# Patient Record
Sex: Male | Born: 1935 | Race: White | Hispanic: No | Marital: Married | Smoking: Former smoker
Health system: Southern US, Community
[De-identification: ages and names within clinical notes are randomized; demographics above are authoritative.]

## PROBLEM LIST (undated history)

## (undated) DIAGNOSIS — I252 Old myocardial infarction: Secondary | ICD-10-CM

## (undated) DIAGNOSIS — E785 Hyperlipidemia, unspecified: Secondary | ICD-10-CM

## (undated) HISTORY — PX: HERNIA REPAIR: SHX51

---

## 2006-11-05 ENCOUNTER — Ambulatory Visit: Payer: Self-pay | Admitting: Ophthalmology

## 2008-04-14 ENCOUNTER — Other Ambulatory Visit: Payer: Self-pay

## 2008-04-14 ENCOUNTER — Ambulatory Visit: Payer: Self-pay | Admitting: Urology

## 2008-04-27 ENCOUNTER — Ambulatory Visit: Payer: Self-pay | Admitting: Urology

## 2009-10-08 ENCOUNTER — Ambulatory Visit: Payer: Self-pay | Admitting: Gastroenterology

## 2011-02-05 ENCOUNTER — Encounter: Payer: Self-pay | Admitting: Family Medicine

## 2011-02-05 ENCOUNTER — Emergency Department: Payer: Self-pay | Admitting: Internal Medicine

## 2011-02-05 ENCOUNTER — Ambulatory Visit (INDEPENDENT_AMBULATORY_CARE_PROVIDER_SITE_OTHER): Payer: Medicare Other | Admitting: Family Medicine

## 2011-02-05 DIAGNOSIS — R5381 Other malaise: Secondary | ICD-10-CM

## 2011-02-05 DIAGNOSIS — R5383 Other fatigue: Secondary | ICD-10-CM

## 2011-02-05 DIAGNOSIS — Z9119 Patient's noncompliance with other medical treatment and regimen: Secondary | ICD-10-CM | POA: Insufficient documentation

## 2011-02-05 DIAGNOSIS — R9431 Abnormal electrocardiogram [ECG] [EKG]: Secondary | ICD-10-CM | POA: Insufficient documentation

## 2011-02-05 DIAGNOSIS — Z91199 Patient's noncompliance with other medical treatment and regimen due to unspecified reason: Secondary | ICD-10-CM | POA: Insufficient documentation

## 2011-02-05 DIAGNOSIS — F17200 Nicotine dependence, unspecified, uncomplicated: Secondary | ICD-10-CM

## 2011-02-05 DIAGNOSIS — R079 Chest pain, unspecified: Secondary | ICD-10-CM

## 2011-02-05 DIAGNOSIS — R0602 Shortness of breath: Secondary | ICD-10-CM

## 2011-02-16 NOTE — Assessment & Plan Note (Signed)
Summary: CHEST PAIN IN THE MIDDLE OF CHEST   Vital Signs:  Patient Profile:   77 Years Old Male CC:      chest pain sternum area per patient Height:     69 inches Weight:      194 pounds O2 Sat:      98 % O2 treatment:    Room Air Temp:     98.2 degrees F oral Pulse rate:   68 / minute Pulse rhythm:   regular Resp:     20 per minute BP sitting:   120 / 80  (left arm) Cuff size:   large  Pt. in pain?   yes    Location:   chest    Type:       heaviness  Vitals Entered By: Providence Crosby LPN (February 05, 2011 12:51 PM)                   Current Allergies (reviewed today): ! ASPIRIN (ASPIRIN)History of Present Illness History from: patient Reason for visit: see chief complaint Chief Complaint: chest pain sternum area per patient History of Present Illness: Chest pain/pressure started 2 days ago on 02/03/2011; Worse with exertion: Relieved by rest;  No known cardiac history;   Pt does not see doctors regularly.  He says he has no SOB except for these episodes.  He says that he walked about 300 - 400 ft 2 days ago and he first noticed a heaviness in the center of the chest that would go away if he would sit down;  Comes and gradually goes away. Pt says "Feels as if I ate something and it went to my chest" Pt has no knowlege of his cholesterol numbers and has not had any blood work done in a long time.  He is very hesitant to seek any medical attention or care but his wife was able to get him to finally come to this clinic today.     REVIEW OF SYSTEMS Constitutional Symptoms       Complains of fatigue.     Denies fever, chills, night sweats, weight loss, and weight gain.  Eyes       Complains of glasses.      Denies change in vision, eye pain, eye discharge, contact lenses, and eye surgery. Ear/Nose/Throat/Mouth       Complains of hearing loss/aids and frequent runny nose.      Denies change in hearing, ear pain, ear discharge, dizziness, frequent nose bleeds, sinus problems, sore  throat, hoarseness, and tooth pain or bleeding.  Respiratory       Complains of shortness of breath.      Denies dry cough, productive cough, wheezing, asthma, bronchitis, and emphysema/COPD.  Cardiovascular       Complains of chest pain and tires easily with exhertion.      Denies murmurs.    Gastrointestinal       Complains of indigestion.      Denies stomach pain, nausea/vomiting, diarrhea, constipation, and blood in bowel movements. Genitourniary       Denies painful urination, kidney stones, and loss of urinary control. Neurological       Denies paralysis, seizures, and fainting/blackouts. Musculoskeletal       Denies muscle pain, joint pain, joint stiffness, decreased range of motion, redness, swelling, muscle weakness, and gout.  Skin       Denies bruising, unusual mles/lumps or sores, and hair/skin or nail changes.  Psych       Denies  mood changes, temper/anger issues, anxiety/stress, speech problems, depression, and sleep problems.  Past History:  Past Medical History: Chronic Active Nicotine Dependence (Chews Tobacco)  Past Surgical History: Denies surgical history  Family History: His mother lived to be 49 years old  Social History: Pt does not believe in going to doctors.  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  He is married.  He chews tobacco.   Physical Exam General appearance: well developed, well nourished, no acute distress Head: normocephalic, atraumatic Eyes: conjunctivae and lids normal Pupils: equal, round, reactive to light Ears: normal, no lesions or deformities Nasal: mucosa pink, nonedematous, no septal deviation, turbinates normal Oral/Pharynx: tongue normal, posterior pharynx without erythema or exudate, poor dentition Neck: neck supple,  trachea midline, no masses Chest/Lungs: no chest wall tenderness; no rales, wheezes, or rhonchi bilateral, breath sounds equal without effort Heart: regular rate and  rhythm, no murmur Abdomen: soft,  non-tender without obvious organomegaly Extremities: normal extremities Neurological: grossly intact and non-focal Skin: no obvious rashes or lesions MSE: oriented to time, place, and person Assessment Problems:   New Problems: ABNORMAL ELECTROCARDIOGRAM (ICD-794.31) SHORTNESS OF BREATH (ICD-786.05) FATIGUE, ACUTE (ICD-780.79) CHEST PAIN, ACUTE (ICD-786.50) TOBACCO ABUSE (ICD-305.1) PERS HX NONCOMPLIANCE W/MED TX PRS HAZARDS HLTH (ICD-V15.81)   Patient Education: Patient and/or caregiver instructed in the following: rest, quit smoking. The risks, benefits and possible side effects were clearly explained and discussed with the patient.  The patient verbalized clear understanding.  The patient was given instructions to return if symptoms don't improve, worsen or new changes develop.  If it is not during clinic hours and the patient cannot get back to this clinic then the patient was told to seek medical care at an available urgent care or emergency department.  The patient verbalized understanding.   Demonstrates willingness to comply.  Plan Planning Comments:   I CALLED [facility] AND SPOKE WITH THEM AND SENT THE PATIENT DIRECTLY OVER TO THEM TO BE EVALUATED AND TREATED FOR CHEST PAIN.   THE PATIENT DECLINED EMS TRANSPORT AND WIFE TOOK HIM OVER BY PRIVATE VEHICLE.  THE PATIENT DID NOT WANT TO GO BUT I TOLD HIM THAT IT WAS ABSOLUTELY ESSENTIAL AND IMPORTANT FOR HIM TO GO ON OVER THERE NOW AND I INSISTED THAT HE GO OVER.  Follow Up: Follow up in 2-3 days if no improvement, Follow up on an as needed basis, Follow up with Primary Physician  The patient and/or caregiver has been counseled thoroughly with regard to medications prescribed including dosage, schedule, interactions, rationale for use, and possible side effects and they verbalize understanding.  Diagnoses and expected course of recovery discussed and will return if not improved as expected or if the condition worsens. Patient and/or  caregiver verbalized understanding.   Patient Instructions: 1)  GO DIRECTLY OVER TO [facility] EMERGENCY ROOM NOW.  2)  I HAVE ALREADY CALLED OVER AND NOTIFIED THEM TO EXPECT FOR YOUR ARRIVAL.  3)  I want you to establish care with a primary care physician and to please start going regularly.

## 2011-03-24 ENCOUNTER — Ambulatory Visit: Payer: Self-pay | Admitting: Cardiology

## 2013-07-17 ENCOUNTER — Inpatient Hospital Stay: Payer: Self-pay | Admitting: Internal Medicine

## 2013-07-17 LAB — COMPREHENSIVE METABOLIC PANEL
Bilirubin,Total: 0.6 mg/dL (ref 0.2–1.0)
Chloride: 107 mmol/L (ref 98–107)
EGFR (African American): >60
EGFR (Non-African Amer.): >60
Osmolality: 283 (ref 275–301)
SGOT(AST): 23 U/L (ref 15–37)

## 2013-07-17 LAB — URINALYSIS, COMPLETE
Bilirubin,UR: NEGATIVE
Glucose,UR: NEGATIVE mg/dL (ref 0–75)
Nitrite: NEGATIVE
RBC,UR: 2 /HPF (ref 0–5)
Specific Gravity: 1.014 (ref 1.003–1.030)

## 2013-07-17 LAB — CBC
HGB: 13.6 g/dL (ref 13.0–18.0)
MCV: 87 fL (ref 80–100)
Platelet: 143 10*3/uL — ABNORMAL LOW (ref 150–440)
RBC: 4.53 10*6/uL (ref 4.40–5.90)
RDW: 13.7 % (ref 11.5–14.5)

## 2013-07-17 LAB — CK TOTAL AND CKMB (NOT AT ARMC): CK, Total: 42 U/L (ref 35–232)

## 2013-07-18 LAB — BASIC METABOLIC PANEL
BUN: 19 mg/dL — ABNORMAL HIGH (ref 7–18)
Calcium, Total: 8.6 mg/dL (ref 8.5–10.1)
EGFR (African American): >60
EGFR (Non-African Amer.): >60
Glucose: 85 mg/dL (ref 65–99)
Osmolality: 281 (ref 275–301)

## 2013-07-18 LAB — CBC WITH DIFFERENTIAL/PLATELET
Basophil %: 0.7 %
Eosinophil #: 0.4 10*3/uL (ref 0.0–0.7)
HCT: 37.3 % — ABNORMAL LOW (ref 40.0–52.0)
Lymphocyte %: 29.3 %
MCH: 29.8 pg (ref 26.0–34.0)
MCHC: 34.2 g/dL (ref 32.0–36.0)
Monocyte %: 9.2 %
Neutrophil #: 3.5 10*3/uL (ref 1.4–6.5)

## 2013-07-18 LAB — LIPID PANEL: Cholesterol: 99 mg/dL (ref 0–200)

## 2015-03-12 NOTE — H&P (Signed)
PATIENT NAME:  SAVINO, WHISENANT MR#:  [id number] DATE OF BIRTH:  [birth year]  DATE OF ADMISSION:  07/17/2013  REFERRING PHYSICIAN:   Dr. Scotty Court PRIMARY CARE PHYSICIAN: Dr. Terance Hart  CHIEF COMPLAINT: Right upper and lower extremity weakness.   HISTORY OF PRESENT ILLNESS: The patient is a pleasant 81 year old male who states he does not have any active medical conditions. He is here with his wife and son. He was in his usual state of health this morning when he woke up. He walked to the mailbox which is about 45 yards away to get the mail and came back. During eating breakfast, as he tried to lift up the milk cup he could go only bring it up a few inches from his face and could not bring it to his lips secondary to weakness. His arm felt heavy, and he tried to get up and he felt like his leg was also not working. It was weak. He denied having any numbness. He has no history of strokes. The family did not notice any speech disturbances. He states he did not have any weakness on the left side of note. He was brought here where he underwent a CAT scan of the head which did not show any acute stroke, but the hospitalist service was contacted for further evaluation and management as he was noted to have decreased strength in the right upper and lower extremities. He feels he is stronger than prior, but he is not at his baseline.   PAST MEDICAL HISTORY: Denies.   ALLERGIES: Denies.   PAST SURGICAL HISTORY: Hernia repair.   SOCIAL HISTORY: No tobacco, alcohol or drug use. He states he is retired for 20 years.   FAMILY HISTORY: Mom with a stroke in her 51s but no other medical issues running in his family.   OUTPATIENT MEDICATIONS: He is on aspirin 81 mg daily, last took it  today, simvastatin 40 mg daily, Toprol-XL 25 mg daily, vitamin D3 1 tab once a day.   REVIEW OF SYSTEMS:   CONSTITUTIONAL: He denies having any fever, fatigue, generalized weakness or weight changes.  EYES: No blurry vision or  double vision.  ENT: No tinnitus or hearing loss. No epistaxis.  RESPIRATORY: No cough, wheezing or COPD.  He has some dyspnea on exertion chronically.  CARDIOVASCULAR: No chest pains, palpitations or swelling in the legs. Denies hypertension or any arrhythmias or butterfly sensations in his chest. No history of heart attack or CHF.  GASTROINTESTINAL: No nausea, vomiting, diarrhea, abdominal pain, hematemesis or melena.  GENITOURINARY: Denies dysuria or hematuria.  HEMATOLOGIC/LYMPHATIC: Denies anemia or easy bruising.  SKIN: No rashes.  MUSCULOSKELETAL: Denies arthritis or gout. NEUROLOGICAL: No history of stroke in the past, although he has had weak spells in the past.  No shakes or seizure activity. No dementia.  PSYCHIATRIC: Denies anxiety or insomnia.   PHYSICAL EXAMINATION: VITAL SIGNS: Temperature on arrival 97.7, pulse rate 71, respiratory rate 18, blood pressure 149/69, O2 sat 99% on room air, last blood pressure is 153/84.  GENERAL: The patient is a well-developed Caucasian male lying in bed, no obvious distress.  HEENT: Normocephalic, atraumatic. Pupils are equal and reactive. Anicteric sclerae. Extraocular muscles intact. Moist mucous membranes.  NECK: Supple. No thyroid tenderness. No cervical lymphadenopathy.  CARDIOVASCULAR: S1, S2, regular rate and rhythm. No significant murmurs, rubs or gallops.  LUNGS: Clear to auscultation without wheezing, rhonchi or rales.  ABDOMEN: Soft, nontender, nondistended. Positive bowel sounds in all quadrants.  EXTREMITIES: No significant lower extremity  edema.  SKIN: No obvious rashes.  NEUROLOGIC: Cranial nerves II through XII are grossly intact. Strength in the right upper extremity is 4+/5. In the right lower extremity it is 4 out of 5. In the left upper and lower extremity it is 5 out of 5. Sensation is intact to light touch on all fields. The patient does have positive Romberg's on the right, and he has rapid alternating movements in the  right upper extremity which is abnormal. He does have abnormal shin-to-heel test on the right lower extremity as well.  PSYCHIATRIC: Awake, alert, oriented x 3 but very nonchalant and states that there is nothing wrong with him, that he would rather he could be home and does not want a stay in the hospital for too long.  LABS AND IMAGING:  A CAT scan of the head shows chronic and involutional changes without evidence of acute abnormalities.  Glucose 111, BUN 21, creatinine 1.08, sodium 140, potassium 3.8. LFTs within normal limits. Troponin negative. CK total negative. WBC 6.9, hemoglobin 13.6, platelets are 143.  EKG: Normal sinus rhythm, low voltage QRS. There are Q waves in lead III. No acute ST elevations or depressions.   ASSESSMENT AND PLAN: We have an 81 year old male who denies having any medical issues, yet is on a beta blocker, aspirin and a statin, who comes in for acute onset right upper extremity as well as lower extremity weakness. He appears to have a right-sided hemiparesis with some coordination issues, and I suspect he is having acute stroke, possibly posterior versus a basal ganglia. At this point, we would admit him to the hospital, although he is reluctant to stay for too long, and he is adamant that he wants to be discharged tomorrow. He does not understand the severity of his illness. We would obtain an MRI of the brain as well as echocardiogram and ultrasound of the carotids for further work-up. We would admit him to telemetry with a remote monitor to also look for any significant arrhythmias. He denies having any history of strokes, CHF in the past. It is possible that this is embolic. His blood pressure is not significantly elevated. Would do frequent neuro checks. Would allow permissive hypertension by holding his  beta blocker and starting him on some IV fluids. Would obtain a PT as well as OT consult and start him on heparin for DVT prophylaxis. He does not appear to have any  speech issues at the moment. Would obtain a lipid profile. Although he is already on an aspirin, we would hold that and start him on clopidogrel at this point, resume the statin as well. Would check hemoglobin A1c as well.   CODE STATUS:  The patient is a FULL CODE.     TOTAL TIME SPENT:  50 minutes.  ____________________________ Krystal Eaton, MD sa:cb D: 07/17/2013 20:19:48 ET T: 07/17/2013 20:53:58 ET JOB#: [id number]  cc: Krystal Eaton, MD, <Dictator> Teena Irani. Terance Hart, MD Krystal Eaton MD ELECTRONICALLY SIGNED 08/12/2013 13:46

## 2015-03-12 NOTE — Discharge Summary (Signed)
PATIENT NAME:  George Hunt, George Hunt MR#:  [id number] DATE OF BIRTH:  [birth year]  DATE OF ADMISSION:  07/17/2013 DATE OF DISCHARGE:  07/18/2013  DISCHARGE DIAGNOSES: 1.  Acute nonhemorrhagic corona radiata infarction.  2.  Diastolic heart failure, chronic.  3.  Hypertension.  4.  Dyslipidemia.   CONDITION ON DISCHARGE:  Stable.   CODE STATUS:  FULL CODE.  MEDICATIONS ON DISCHARGE: 1.  Simvastatin 40 mg once a day.  2.  Aspirin 81 mg once a day.  3.  Vitamin D3 once a day.  4.  Clopidogrel 75 mg once a day.  5.  Lisinopril 20 mg oral tablet once a day.   HOME HEALTH ON DISCHARGE:  Yes.   DIET ON DISCHARGE:  Low sodium, low fat, low cholesterol.  Diet consistency regular.   TIME FRAME TO FOLLOW-UP:  Within 2 to 4 weeks.  Routine follow-up with PMD advised.   HISTORY OF PRESENT ILLNESS:  An 81 year old male was in his usual state of health, but while trying to have his breakfast he tried to life a cup of milk.  He could not lift it up and felt severely weak.  He did not have any history of stroke.  He tried to wait for a few hours at home for his weakness on the right side of the body which was not going away so finally his wife convinced him to come to the Emergency Room.  He remained without much problem.  His weakness improved significantly.    He was taking aspirin, so added Plavix.  PT suggested home health aide which was arranged for him.  Carotid Doppler was not showing any blockages and so he was discharged home for further management.   OTHER MEDICAL ISSUES: 1.  Hypertension.  Continued Toprol-XL 25 mg daily.  2.  Dyslipidemia.  He was taking statin which we will continue.  3.  Tobacco chewing.  Advised tobacco cessation.  He agreed for that.   IMPORTANT LABORATORY RESULTS IN THE HOSPITAL:  White cell count was 6.9, hemoglobin was 13.6 and platelet count was 143.  Creatinine was 1.08 on admission.  Ultrasound carotid Doppler study, no evidence of hemodynamically significant stenosis.   MRI result as mentioned above.  Echocardiogram, mild global left ventricular systolic function, impaired relaxation LV diastolic filling, ejection fraction 45% to 50%.   Total time spent in this discharge 45 minutes.    ____________________________ Hope Pigeon Elisabeth Pigeon, MD vgv:ea D: 07/22/2013 22:26:15 ET T: 07/23/2013 00:29:30 ET JOB#: [id number]  cc: Hope Pigeon. Elisabeth Pigeon, MD, <Dictator> Altamese Dilling MD ELECTRONICALLY SIGNED 07/25/2013 19:10

## 2015-08-07 ENCOUNTER — Emergency Department
Admission: EM | Admit: 2015-08-07 | Discharge: 2015-08-07 | Disposition: A | Discharge status: Home / Self Care | Payer: Medicare Other | Attending: Emergency Medicine | Admitting: Emergency Medicine

## 2015-08-07 ENCOUNTER — Encounter: Payer: Self-pay | Admitting: Emergency Medicine

## 2015-08-07 ENCOUNTER — Emergency Department: Payer: Medicare Other

## 2015-08-07 DIAGNOSIS — Z87891 Personal history of nicotine dependence: Secondary | ICD-10-CM | POA: Insufficient documentation

## 2015-08-07 DIAGNOSIS — R109 Unspecified abdominal pain: Secondary | ICD-10-CM

## 2015-08-07 DIAGNOSIS — K8020 Calculus of gallbladder without cholecystitis without obstruction: Secondary | ICD-10-CM | POA: Insufficient documentation

## 2015-08-07 DIAGNOSIS — Z79899 Other long term (current) drug therapy: Secondary | ICD-10-CM | POA: Diagnosis not present

## 2015-08-07 DIAGNOSIS — Z7902 Long term (current) use of antithrombotics/antiplatelets: Secondary | ICD-10-CM | POA: Insufficient documentation

## 2015-08-07 DIAGNOSIS — R1013 Epigastric pain: Secondary | ICD-10-CM | POA: Diagnosis present

## 2015-08-07 HISTORY — DX: Old myocardial infarction: I25.2

## 2015-08-07 LAB — BASIC METABOLIC PANEL
ANION GAP: 10 (ref 5–15)
BUN: 19 mg/dL (ref 6–20)
CALCIUM: 9.2 mg/dL (ref 8.9–10.3)
CO2: 24 mmol/L (ref 22–32)
Chloride: 104 mmol/L (ref 101–111)
Creatinine, Ser: 0.88 mg/dL (ref 0.61–1.24)
Glucose, Bld: 130 mg/dL — ABNORMAL HIGH (ref 65–99)
POTASSIUM: 4.2 mmol/L (ref 3.5–5.1)
Sodium: 138 mmol/L (ref 135–145)

## 2015-08-07 LAB — HEPATIC FUNCTION PANEL
ALK PHOS: 71 U/L (ref 38–126)
ALT: 10 U/L — AB (ref 17–63)
AST: 27 U/L (ref 15–41)
Albumin: 4 g/dL (ref 3.5–5.0)
Bilirubin, Direct: 0.2 mg/dL (ref 0.1–0.5)
Indirect Bilirubin: 1 mg/dL — ABNORMAL HIGH (ref 0.3–0.9)
TOTAL PROTEIN: 7 g/dL (ref 6.5–8.1)
Total Bilirubin: 1.2 mg/dL (ref 0.3–1.2)

## 2015-08-07 LAB — CBC
HEMATOCRIT: 44.2 % (ref 40.0–52.0)
HEMOGLOBIN: 14.6 g/dL (ref 13.0–18.0)
MCH: 28.6 pg (ref 26.0–34.0)
MCHC: 33 g/dL (ref 32.0–36.0)
MCV: 86.5 fL (ref 80.0–100.0)
Platelets: 144 10*3/uL — ABNORMAL LOW (ref 150–440)
RBC: 5.11 MIL/uL (ref 4.40–5.90)
RDW: 13.6 % (ref 11.5–14.5)
WBC: 11.8 10*3/uL — ABNORMAL HIGH (ref 3.8–10.6)

## 2015-08-07 LAB — TROPONIN I

## 2015-08-07 LAB — LIPASE, BLOOD: Lipase: 25 U/L (ref 22–51)

## 2015-08-07 MED ORDER — PANTOPRAZOLE SODIUM 40 MG IV SOLR
40.0000 mg | Freq: Once | INTRAVENOUS | Status: AC
  Administered 2015-08-07: 40 mg via INTRAVENOUS
  Filled 2015-08-07: qty 40

## 2015-08-07 MED ORDER — ONDANSETRON HCL 4 MG/2ML IJ SOLN
4.0000 mg | Freq: Once | INTRAMUSCULAR | Status: AC
  Administered 2015-08-07: 4 mg via INTRAVENOUS
  Filled 2015-08-07: qty 2

## 2015-08-07 MED ORDER — ASPIRIN 81 MG PO CHEW
324.0000 mg | CHEWABLE_TABLET | Freq: Once | ORAL | Status: AC
  Administered 2015-08-07: 324 mg via ORAL
  Filled 2015-08-07: qty 4

## 2015-08-07 MED ORDER — PANTOPRAZOLE SODIUM 20 MG PO TBEC: 20.0000 mg | DELAYED_RELEASE_TABLET | Freq: Every day | ORAL | Status: DC

## 2015-08-07 NOTE — Discharge Instructions (Signed)
While your initial complaint was chest pain, your discomfort was more notable in the epigastric area. An ultrasound was performed which showed gallstones, but without sign of inflammation, obstruction, or infection.  He felt better following your stay in the emergency department. We discussed the option of staying in the hospital, but agreed that discharge home with close follow-up is reasonable. Please call for an appointment to see Dr. Lady Gary on Monday. Call and make an appointment to see your regular doctor later this week. Return to the emergency department if you have more upper abdominal pain, if you do have chest pain, shortness of breath, nausea vomiting, or other urgent concerns.  Cholelithiasis Cholelithiasis (also called gallstones) is a form of gallbladder disease in which gallstones form in your gallbladder. The gallbladder is an organ that stores bile made in the liver, which helps digest fats. Gallstones begin as small crystals and slowly grow into stones. Gallstone pain occurs when the gallbladder spasms and a gallstone is blocking the duct. Pain can also occur when a stone passes out of the duct.  RISK FACTORS  Being male.   Having multiple pregnancies. Health care providers sometimes advise removing diseased gallbladders before future pregnancies.   Being obese.  Eating a diet heavy in fried foods and fat.   Being older than 60 years and increasing age.   Prolonged use of medicines containing male hormones.   Having diabetes mellitus.   Rapidly losing weight.   Having a family history of gallstones (heredity).  SYMPTOMS  Nausea.   Vomiting.  Abdominal pain.   Yellowing of the skin (jaundice).   Sudden pain. It may persist from several minutes to several hours.  Fever.   Tenderness to the touch. In some cases, when gallstones do not move into the bile duct, people have no pain or symptoms. These are called "silent" gallstones.  TREATMENT Silent  gallstones do not need treatment. In severe cases, emergency surgery may be required. Options for treatment include:  Surgery to remove the gallbladder. This is the most common treatment.  Medicines. These do not always work and may take 6-12 months or more to work.  Shock wave treatment (extracorporeal biliary lithotripsy). In this treatment an ultrasound machine sends shock waves to the gallbladder to break gallstones into smaller pieces that can pass into the intestines or be dissolved by medicine. HOME CARE INSTRUCTIONS   Only take over-the-counter or prescription medicines for pain, discomfort, or fever as directed by your health care provider.   Follow a low-fat diet until seen again by your health care provider. Fat causes the gallbladder to contract, which can result in pain.   Follow up with your health care provider as directed. Attacks are almost always recurrent and surgery is usually required for permanent treatment.  SEEK IMMEDIATE MEDICAL CARE IF:   Your pain increases and is not controlled by medicines.   You have a fever or persistent symptoms for more than 2-3 days.   You have a fever and your symptoms suddenly get worse.   You have persistent nausea and vomiting.  MAKE SURE YOU:   Understand these instructions.  Will watch your condition.  Will get help right away if you are not doing well or get worse. Document Released: 11/02/2005 Document Revised: 07/09/2013 Document Reviewed: 04/30/2013 [facility] Patient Information 2015 [place], [place]. This information is not intended to replace advice given to you by your health care provider. Make sure you discuss any questions you have with your health care provider.

## 2015-08-07 NOTE — ED Provider Notes (Signed)
[facility] Emergency Department Berlie Hatchel Note  ____________________________________________  Time seen: 1020  I have reviewed the triage vital signs and the nursing notes.   HISTORY  Chief Complaint Chest Pain     HPI George Hunt is a 81 y.o. male who reports that he began to have chest discomfort at 1:30 AM last night. He points to his epigastric area as the location for this discomfort. He has no radiation. He has no chest pain or pressure further up on the sternum. He does report some nausea, but no shortness of breath and no diaphoresis.  The patient reports she has not been able to sleep tonight. He is a little bit fatigued currently.  The patient has had a heart catheterization 2 years ago. The cardiac catheter showed some minor blockage that had some re-vascular growth around it. No known myocardial infarction prior or since.     Past Medical History  Diagnosis Date  . MI, old     Patient Active Problem List   Diagnosis Date Noted  . TOBACCO ABUSE 02/05/2011  . ABNORMAL ELECTROCARDIOGRAM 02/05/2011  . PERS HX NONCOMPLIANCE W/MED TX PRS HAZARDS HLTH 02/05/2011    Past Surgical History  Procedure Laterality Date  . Hernia repair      Current Outpatient Rx  Name  Route  Sig  Dispense  Refill  . clopidogrel (PLAVIX) 75 MG tablet   Oral   Take 75 mg by mouth daily.         . simvastatin (ZOCOR) 40 MG tablet   Oral   Take 40 mg by mouth daily.         . pantoprazole (PROTONIX) 20 MG tablet   Oral   Take 1 tablet (20 mg total) by mouth daily.   14 tablet   0     Allergies Review of patient's allergies indicates no known allergies.  No family history on file.  Social History Social History  Substance Use Topics  . Smoking status: Former Games developer  . Smokeless tobacco: None  . Alcohol Use: No    Review of Systems  Constitutional: Negative for fever. ENT: Negative for sore throat. Cardiovascular: Positive for chest  discomfort/epigastric discomfort. See history of present illness Respiratory: Negative for shortness of breath. Gastrointestinal: Negative for vomiting and diarrhea. Positive for epigastric discomfort. See history of present illness Genitourinary: Negative for dysuria. Musculoskeletal: No myalgias or injuries. Skin: Negative for rash. Neurological: Negative for headaches   10-point ROS otherwise negative.  ____________________________________________   PHYSICAL EXAM:  VITAL SIGNS: ED Triage Vitals  Enc Vitals Group     BP 08/07/15 0941 150/69 mmHg     Pulse Rate 08/07/15 0941 68     Resp 08/07/15 0941 18     Temp 08/07/15 0941 97.6 F (36.4 C)     Temp Source 08/07/15 0941 Oral     SpO2 08/07/15 0941 97 %     Weight 08/07/15 0936 185 lb (83.915 kg)     Height 08/07/15 0936 5\' 10"  (1.778 m)     Head Cir --      Peak Flow --      Pain Score 08/07/15 0936 5     Pain Loc --      Pain Edu? --      Excl. in GC? --     Constitutional:  Alert and oriented. Appears mildly fatigued but in no distress. ENT   Head: Normocephalic and atraumatic.   Nose: No congestion/rhinnorhea.   Mouth/Throat:  Mucous membranes are moist. Cardiovascular: Normal rate, regular rhythm, no murmur noted Respiratory:  Normal respiratory effort, no tachypnea.    Breath sounds are clear and equal bilaterally.  Gastrointestinal: Soft, mild tenderness in the epigastric area. No distention.  Back: No muscle spasm, no tenderness, no CVA tenderness. Musculoskeletal: No deformity noted. Nontender with normal range of motion in all extremities.  No noted edema. Neurologic:  Normal speech and language. No gross focal neurologic deficits are appreciated.  Skin:  Skin is warm, dry. No rash noted. Psychiatric: Mood and affect are normal. Speech and behavior are normal.  ____________________________________________    LABS (pertinent positives/negatives)  Labs Reviewed  BASIC METABOLIC PANEL -  Abnormal; Notable for the following:    Glucose, Bld 130 (*)    All other components within normal limits  CBC - Abnormal; Notable for the following:    WBC 11.8 (*)    Platelets 144 (*)    All other components within normal limits  HEPATIC FUNCTION PANEL - Abnormal; Notable for the following:    ALT 10 (*)    Indirect Bilirubin 1.0 (*)    All other components within normal limits  TROPONIN I  LIPASE, BLOOD     ____________________________________________   EKG  ED ECG REPORT I, KAMINSKI,DAVID W, the attending physician, personally viewed and interpreted this ECG.   Date: 08/07/2015  EKG Time: 939  Rate: 68  Rhythm: Normal sinus rhythm  Axis: Normal  Intervals: Normal  ST&T Change: None noted   ____________________________________________    RADIOLOGY  Chest x-ray: FINDINGS: The heart size and vascular pattern are normal. There is mild bibasilar atelectasis. Lungs otherwise clear.  IMPRESSION: No acute findings   Ultrasound:  IMPRESSION: Cholelithiasis without sonographic evidence of acute cholecystitis.  Right renal cysts.  Hyperechoic appearance of the liver parenchyma, usually associated with hepatic steatosis. ____________________________________________    INITIAL IMPRESSION / ASSESSMENT AND PLAN / ED COURSE  Pertinent labs & imaging results that were available during my care of the patient were reviewed by me and considered in my medical decision making (see chart for details).  Well-appearing 81 year old male in no acute distress. He reports chest pain, but the location is much more epigastric. Troponin is pending and cardiac conditions are still on the differential diagnosis, but I'm more concerned about gastrointestinal issues at this time. I have added on a lipase and hepatic function tests. Labs are pending. EKG shows no ischemic changes. We will treat him with Protonix +  aspirin.  ----------------------------------------- 1:30 PM on  08/07/2015 -----------------------------------------  Troponin is negative. Blood tests overall are reasonable. Chest x-ray is negative. Ultrasound of gallbladder and biliary tree is pending.  ----------------------------------------- 1:49 PM on 08/07/2015 -----------------------------------------  Ultrasound shows cholelithiasis without evidence of cholecystitis.  Reexamination of the patient finds him comfortable. He reports he feels better. No acute distress.  I have discussed with him and the family the option of staying in the hospital. I have a low suspicion for this being a cardiac source and uncomfortable with him going home. The patient prefers discharge home.  I've spoken with Dr. Juliann Pares, [facility] clinic cardiology. He agrees with this plan. He will pass word on to Dr. Lady Gary, who had performed the diagnostic catheterization 2 years ago, to help arrange for close follow-up.  ____________________________________________   FINAL CLINICAL IMPRESSION(S) / ED DIAGNOSES  Final diagnoses:  Abdominal pain  Calculus of gallbladder without cholecystitis without obstruction      Darien Ramus, MD 08/07/15 1359

## 2015-08-07 NOTE — ED Notes (Signed)
Pt has midsternal cp that began 0100 this am. Son states he has had an MI in the past.

## 2015-11-24 DIAGNOSIS — K219 Gastro-esophageal reflux disease without esophagitis: Secondary | ICD-10-CM | POA: Diagnosis not present

## 2015-11-24 DIAGNOSIS — R413 Other amnesia: Secondary | ICD-10-CM | POA: Diagnosis not present

## 2015-11-24 DIAGNOSIS — I10 Essential (primary) hypertension: Secondary | ICD-10-CM | POA: Diagnosis not present

## 2015-11-24 DIAGNOSIS — I679 Cerebrovascular disease, unspecified: Secondary | ICD-10-CM | POA: Diagnosis not present

## 2015-11-24 DIAGNOSIS — E782 Mixed hyperlipidemia: Secondary | ICD-10-CM | POA: Diagnosis not present

## 2015-11-24 DIAGNOSIS — G629 Polyneuropathy, unspecified: Secondary | ICD-10-CM | POA: Diagnosis not present

## 2016-05-24 DIAGNOSIS — I679 Cerebrovascular disease, unspecified: Secondary | ICD-10-CM | POA: Diagnosis not present

## 2016-05-24 DIAGNOSIS — E782 Mixed hyperlipidemia: Secondary | ICD-10-CM | POA: Diagnosis not present

## 2016-05-24 DIAGNOSIS — I10 Essential (primary) hypertension: Secondary | ICD-10-CM | POA: Diagnosis not present

## 2016-05-24 DIAGNOSIS — R413 Other amnesia: Secondary | ICD-10-CM | POA: Diagnosis not present

## 2016-05-24 DIAGNOSIS — K219 Gastro-esophageal reflux disease without esophagitis: Secondary | ICD-10-CM | POA: Diagnosis not present

## 2016-05-24 DIAGNOSIS — G629 Polyneuropathy, unspecified: Secondary | ICD-10-CM | POA: Diagnosis not present

## 2016-05-26 DIAGNOSIS — E782 Mixed hyperlipidemia: Secondary | ICD-10-CM | POA: Diagnosis not present

## 2016-11-24 DIAGNOSIS — K5909 Other constipation: Secondary | ICD-10-CM | POA: Diagnosis not present

## 2016-11-24 DIAGNOSIS — G629 Polyneuropathy, unspecified: Secondary | ICD-10-CM | POA: Diagnosis not present

## 2016-11-24 DIAGNOSIS — K219 Gastro-esophageal reflux disease without esophagitis: Secondary | ICD-10-CM | POA: Diagnosis not present

## 2016-11-24 DIAGNOSIS — E782 Mixed hyperlipidemia: Secondary | ICD-10-CM | POA: Diagnosis not present

## 2016-11-24 DIAGNOSIS — I679 Cerebrovascular disease, unspecified: Secondary | ICD-10-CM | POA: Diagnosis not present

## 2016-11-24 DIAGNOSIS — R413 Other amnesia: Secondary | ICD-10-CM | POA: Diagnosis not present

## 2016-11-24 DIAGNOSIS — I10 Essential (primary) hypertension: Secondary | ICD-10-CM | POA: Diagnosis not present

## 2017-04-27 ENCOUNTER — Inpatient Hospital Stay
Admission: EM | Admit: 2017-04-27 | Discharge: 2017-04-29 | DRG: 871 | Disposition: A | Discharge status: Home / Self Care | Payer: PPO | Attending: Internal Medicine | Admitting: Internal Medicine

## 2017-04-27 ENCOUNTER — Encounter: Payer: Self-pay | Admitting: Emergency Medicine

## 2017-04-27 ENCOUNTER — Emergency Department: Payer: PPO

## 2017-04-27 DIAGNOSIS — B9620 Unspecified Escherichia coli [E. coli] as the cause of diseases classified elsewhere: Secondary | ICD-10-CM | POA: Diagnosis not present

## 2017-04-27 DIAGNOSIS — Z87891 Personal history of nicotine dependence: Secondary | ICD-10-CM

## 2017-04-27 DIAGNOSIS — N3001 Acute cystitis with hematuria: Secondary | ICD-10-CM | POA: Diagnosis not present

## 2017-04-27 DIAGNOSIS — R4182 Altered mental status, unspecified: Secondary | ICD-10-CM

## 2017-04-27 DIAGNOSIS — I2510 Atherosclerotic heart disease of native coronary artery without angina pectoris: Secondary | ICD-10-CM | POA: Diagnosis not present

## 2017-04-27 DIAGNOSIS — Z7982 Long term (current) use of aspirin: Secondary | ICD-10-CM

## 2017-04-27 DIAGNOSIS — I252 Old myocardial infarction: Secondary | ICD-10-CM

## 2017-04-27 DIAGNOSIS — R6520 Severe sepsis without septic shock: Secondary | ICD-10-CM | POA: Diagnosis present

## 2017-04-27 DIAGNOSIS — A419 Sepsis, unspecified organism: Principal | ICD-10-CM | POA: Diagnosis present

## 2017-04-27 DIAGNOSIS — N400 Enlarged prostate without lower urinary tract symptoms: Secondary | ICD-10-CM | POA: Diagnosis present

## 2017-04-27 DIAGNOSIS — E785 Hyperlipidemia, unspecified: Secondary | ICD-10-CM | POA: Diagnosis present

## 2017-04-27 DIAGNOSIS — N390 Urinary tract infection, site not specified: Secondary | ICD-10-CM

## 2017-04-27 DIAGNOSIS — G9341 Metabolic encephalopathy: Secondary | ICD-10-CM | POA: Diagnosis not present

## 2017-04-27 DIAGNOSIS — Z7902 Long term (current) use of antithrombotics/antiplatelets: Secondary | ICD-10-CM | POA: Diagnosis not present

## 2017-04-27 DIAGNOSIS — Z79899 Other long term (current) drug therapy: Secondary | ICD-10-CM

## 2017-04-27 DIAGNOSIS — J9811 Atelectasis: Secondary | ICD-10-CM | POA: Diagnosis not present

## 2017-04-27 DIAGNOSIS — R509 Fever, unspecified: Secondary | ICD-10-CM | POA: Diagnosis not present

## 2017-04-27 LAB — COMPREHENSIVE METABOLIC PANEL
ALT: 11 U/L — AB (ref 17–63)
AST: 20 U/L (ref 15–41)
Albumin: 3.5 g/dL (ref 3.5–5.0)
Alkaline Phosphatase: 83 U/L (ref 38–126)
Anion gap: 10 (ref 5–15)
BILIRUBIN TOTAL: 2.4 mg/dL — AB (ref 0.3–1.2)
BUN: 22 mg/dL — AB (ref 6–20)
CO2: 23 mmol/L (ref 22–32)
CREATININE: 1.15 mg/dL (ref 0.61–1.24)
Calcium: 8.8 mg/dL — ABNORMAL LOW (ref 8.9–10.3)
Chloride: 103 mmol/L (ref 101–111)
GFR calc Af Amer: >60 mL/min (ref >60)
GFR, EST NON AFRICAN AMERICAN: 56 mL/min — AB (ref >60)
Glucose, Bld: 150 mg/dL — ABNORMAL HIGH (ref 65–99)
Potassium: 3.8 mmol/L (ref 3.5–5.1)
Sodium: 136 mmol/L (ref 135–145)
TOTAL PROTEIN: 6.9 g/dL (ref 6.5–8.1)

## 2017-04-27 LAB — CBC WITH DIFFERENTIAL/PLATELET
BASOS ABS: 0.1 10*3/uL (ref 0–0.1)
BASOS PCT: 1 %
EOS ABS: 0 10*3/uL (ref 0–0.7)
Eosinophils Relative: 0 %
HEMATOCRIT: 40 % (ref 40.0–52.0)
HEMOGLOBIN: 13.5 g/dL (ref 13.0–18.0)
Lymphocytes Relative: 3 %
Lymphs Abs: 0.5 10*3/uL — ABNORMAL LOW (ref 1.0–3.6)
MCH: 29.1 pg (ref 26.0–34.0)
MCHC: 33.7 g/dL (ref 32.0–36.0)
MCV: 86.6 fL (ref 80.0–100.0)
Monocytes Absolute: 1.2 10*3/uL — ABNORMAL HIGH (ref 0.2–1.0)
Monocytes Relative: 8 %
NEUTROS ABS: 14.6 10*3/uL — AB (ref 1.4–6.5)
NEUTROS PCT: 88 %
Platelets: 132 10*3/uL — ABNORMAL LOW (ref 150–440)
RBC: 4.62 MIL/uL (ref 4.40–5.90)
RDW: 13.4 % (ref 11.5–14.5)
WBC: 16.4 10*3/uL — AB (ref 3.8–10.6)

## 2017-04-27 LAB — PROTIME-INR
INR: 1.27
PROTHROMBIN TIME: 16 s — AB (ref 11.4–15.2)

## 2017-04-27 LAB — URINALYSIS, COMPLETE (UACMP) WITH MICROSCOPIC
Bilirubin Urine: NEGATIVE
GLUCOSE, UA: NEGATIVE mg/dL
Ketones, ur: 20 mg/dL — AB
Nitrite: NEGATIVE
PH: 5 (ref 5.0–8.0)
PROTEIN: 100 mg/dL — AB
Specific Gravity, Urine: 1.024 (ref 1.005–1.030)

## 2017-04-27 LAB — TSH: TSH: 0.931 u[IU]/mL (ref 0.350–4.500)

## 2017-04-27 LAB — LACTIC ACID, PLASMA: Lactic Acid, Venous: 1.3 mmol/L (ref 0.5–1.9)

## 2017-04-27 LAB — LIPASE, BLOOD: LIPASE: 22 U/L (ref 11–51)

## 2017-04-27 LAB — TROPONIN I

## 2017-04-27 LAB — PROCALCITONIN: Procalcitonin: 0.19 ng/mL

## 2017-04-27 LAB — APTT: APTT: 34 s (ref 24–36)

## 2017-04-27 MED ORDER — ACETAMINOPHEN 650 MG RE SUPP: 650.0000 mg | Freq: Four times a day (QID) | RECTAL | Status: DC | PRN

## 2017-04-27 MED ORDER — ACETAMINOPHEN 325 MG PO TABS: 650.0000 mg | ORAL_TABLET | Freq: Four times a day (QID) | ORAL | Status: DC | PRN

## 2017-04-27 MED ORDER — ENOXAPARIN SODIUM 40 MG/0.4ML ~~LOC~~ SOLN
40.0000 mg | SUBCUTANEOUS | Status: DC
  Administered 2017-04-27: 21:00:00 40 mg via SUBCUTANEOUS
  Filled 2017-04-27 (×2): qty 0.4

## 2017-04-27 MED ORDER — VANCOMYCIN HCL IN DEXTROSE 1-5 GM/200ML-% IV SOLN
1000.0000 mg | INTRAVENOUS | Status: DC
  Administered 2017-04-27: 21:00:00 1000 mg via INTRAVENOUS
  Filled 2017-04-27 (×2): qty 200

## 2017-04-27 MED ORDER — SIMVASTATIN 20 MG PO TABS
40.0000 mg | ORAL_TABLET | Freq: Every day | ORAL | Status: DC
  Administered 2017-04-27 – 2017-04-29 (×3): 40 mg via ORAL
  Filled 2017-04-27 (×3): qty 2

## 2017-04-27 MED ORDER — PIPERACILLIN-TAZOBACTAM 3.375 G IVPB 30 MIN
3.3750 g | Freq: Once | INTRAVENOUS | Status: AC
  Administered 2017-04-27: 3.375 g via INTRAVENOUS
  Filled 2017-04-27: qty 50

## 2017-04-27 MED ORDER — VANCOMYCIN HCL IN DEXTROSE 1-5 GM/200ML-% IV SOLN
1000.0000 mg | Freq: Once | INTRAVENOUS | Status: AC
  Administered 2017-04-27: 1000 mg via INTRAVENOUS
  Filled 2017-04-27: qty 200

## 2017-04-27 MED ORDER — SODIUM CHLORIDE 0.9 % IV BOLUS (SEPSIS)
500.0000 mL | Freq: Once | INTRAVENOUS | Status: AC
  Administered 2017-04-27: 500 mL via INTRAVENOUS

## 2017-04-27 MED ORDER — PIPERACILLIN-TAZOBACTAM 3.375 G IVPB
3.3750 g | Freq: Three times a day (TID) | INTRAVENOUS | Status: DC
  Administered 2017-04-27 – 2017-04-28 (×2): 3.375 g via INTRAVENOUS
  Filled 2017-04-27 (×4): qty 50

## 2017-04-27 MED ORDER — ASPIRIN EC 81 MG PO TBEC
81.0000 mg | DELAYED_RELEASE_TABLET | Freq: Every day | ORAL | Status: DC
  Administered 2017-04-27 – 2017-04-29 (×3): 81 mg via ORAL
  Filled 2017-04-27 (×3): qty 1

## 2017-04-27 MED ORDER — SODIUM CHLORIDE 0.9 % IV SOLN
INTRAVENOUS | Status: DC
  Administered 2017-04-27 – 2017-04-28 (×2): via INTRAVENOUS

## 2017-04-27 MED ORDER — SODIUM CHLORIDE 0.9 % IV BOLUS (SEPSIS)
1000.0000 mL | Freq: Once | INTRAVENOUS | Status: AC
  Administered 2017-04-27: 1000 mL via INTRAVENOUS

## 2017-04-27 MED ORDER — ONDANSETRON HCL 4 MG PO TABS: 4.0000 mg | ORAL_TABLET | Freq: Four times a day (QID) | ORAL | Status: DC | PRN

## 2017-04-27 MED ORDER — ONDANSETRON HCL 4 MG/2ML IJ SOLN: 4.0000 mg | Freq: Four times a day (QID) | INTRAMUSCULAR | Status: DC | PRN

## 2017-04-27 MED ORDER — DOCUSATE SODIUM 100 MG PO CAPS
100.0000 mg | ORAL_CAPSULE | Freq: Two times a day (BID) | ORAL | Status: DC
  Administered 2017-04-27 – 2017-04-29 (×3): 100 mg via ORAL
  Filled 2017-04-27 (×4): qty 1

## 2017-04-27 MED ORDER — CLOPIDOGREL BISULFATE 75 MG PO TABS
75.0000 mg | ORAL_TABLET | Freq: Every day | ORAL | Status: DC
  Administered 2017-04-27 – 2017-04-29 (×3): 75 mg via ORAL
  Filled 2017-04-27 (×3): qty 1

## 2017-04-27 NOTE — ED Provider Notes (Signed)
[facility] Emergency Department Provider Note  ____________________________________________  Time seen: Approximately 4:38 PM  I have reviewed the triage vital signs and the nursing notes.   HISTORY  Chief Complaint Altered Mental Status  Level 5 caveat:  Portions of the history and physical were unable to be obtained due to the patient's acute illness and altered mental status   HPI George Hunt is a 83 y.o. male brought to the ED via EMS due to fever and altered mental status since yesterday. Patient does not know why he is here. He reports that he feels fine and does not note any acute complaints. EMS report that patient has had urinary incontinence just for the last day or 2.     Past Medical History:  Diagnosis Date  . MI, old      Patient Active Problem List   Diagnosis Date Noted  . TOBACCO ABUSE 02/05/2011  . ABNORMAL ELECTROCARDIOGRAM 02/05/2011  . PERS HX NONCOMPLIANCE W/MED TX PRS HAZARDS HLTH 02/05/2011     Past Surgical History:  Procedure Laterality Date  . HERNIA REPAIR       Prior to Admission medications   Medication Sig Start Date End Date Taking? Authorizing Provider  aspirin EC 81 MG tablet Take 81 mg by mouth daily.   Yes [provider]  clopidogrel (PLAVIX) 75 MG tablet Take 75 mg by mouth daily.   Yes [provider]  simvastatin (ZOCOR) 40 MG tablet Take 40 mg by mouth daily.   Yes [provider]  pantoprazole (PROTONIX) 20 MG tablet Take 1 tablet (20 mg total) by mouth daily. 08/07/15   Darien Ramus, MD     Allergies Patient has no known allergies.   History reviewed. No pertinent family history.  Social History Social History  Substance Use Topics  . Smoking status: Former Games developer  . Smokeless tobacco: Never Used  . Alcohol use No    Review of Systems Unable to reliably obtained due to altered mental  status.  ____________________________________________   PHYSICAL EXAM:  VITAL SIGNS: ED Triage Vitals  Enc Vitals Group     BP 04/27/17 1613 (!) 142/65     Pulse Rate 04/27/17 1613 88     Resp 04/27/17 1613 20     Temp 04/27/17 1613 (!) 102.1 F (38.9 C)     Temp Source 04/27/17 1613 Rectal     SpO2 04/27/17 1613 94 %     Weight 04/27/17 1612 180 lb (81.6 kg)     Height 04/27/17 1612 5\' 9"  (1.753 m)     Head Circumference --      Peak Flow --      Pain Score --      Pain Loc --      Pain Edu? --      Excl. in GC? --     Vital signs reviewed, nursing assessments reviewed.   Constitutional:   Alert and orientedTo self. Not in distress. Eyes:   No scleral icterus.  EOMI. No nystagmus. No conjunctival pallor. PERRL. ENT   Head:   Normocephalic and atraumatic.   Nose:   No congestion/rhinnorhea.    Mouth/Throat:   Dry mucous membranes, no pharyngeal erythema. No peritonsillar mass.    Neck:   No meningismus. Full ROM Hematological/Lymphatic/Immunilogical:   No cervical lymphadenopathy. Cardiovascular:   RRR. Symmetric bilateral radial and DP pulses.  No murmurs.  Respiratory:   Normal respiratory effort without tachypnea/retractions. Breath sounds are clear and equal bilaterally.  No wheezes/rales/rhonchi. Gastrointestinal:   Soft and nontender. Non distended. There is no CVA tenderness.  No rebound, rigidity, or guarding. Genitourinary:   deferred Musculoskeletal:   Normal range of motion in all extremities. No joint effusions.  No lower extremity tenderness.  No edema. Neurologic:   Normal speech and language.  Motor grossly intact. No gross focal neurologic deficits are appreciated.  Skin:    Skin is warm, dry and intact. No rash noted.  No petechiae, purpura, or bullae.  ____________________________________________    LABS (pertinent positives/negatives) (all labs ordered are listed, but only abnormal results are displayed) Labs Reviewed  COMPREHENSIVE  METABOLIC PANEL - Abnormal; Notable for the following:       Result Value   Glucose, Bld 150 (*)    BUN 22 (*)    Calcium 8.8 (*)    ALT 11 (*)    Total Bilirubin 2.4 (*)    GFR calc non Af Amer 56 (*)    All other components within normal limits  URINALYSIS, COMPLETE (UACMP) WITH MICROSCOPIC - Abnormal; Notable for the following:    Color, Urine AMBER (*)    APPearance CLOUDY (*)    Hgb urine dipstick MODERATE (*)    Ketones, ur 20 (*)    Protein, ur 100 (*)    Leukocytes, UA MODERATE (*)    Bacteria, UA MANY (*)    Squamous Epithelial / LPF 0-5 (*)    All other components within normal limits  CBC WITH DIFFERENTIAL/PLATELET - Abnormal; Notable for the following:    WBC 16.4 (*)    Platelets 132 (*)    Neutro Abs 14.6 (*)    Lymphs Abs 0.5 (*)    Monocytes Absolute 1.2 (*)    All other components within normal limits  PROTIME-INR - Abnormal; Notable for the following:    Prothrombin Time 16.0 (*)    All other components within normal limits  CULTURE, BLOOD (ROUTINE X 2)  CULTURE, BLOOD (ROUTINE X 2)  URINE CULTURE  LACTIC ACID, PLASMA  APTT  LACTIC ACID, PLASMA  LIPASE, BLOOD  TROPONIN I  PROCALCITONIN   ____________________________________________   EKG  Interpreted by me Normal sinus rhythm rate of 88, normal axis and intervals. Normal QRS ST segments and T waves. One PVC on the strip.  ____________________________________________    RADIOLOGY  Dg Chest Port 1 View  Result Date: 04/27/2017 CLINICAL DATA:  Altered mental status. EXAM: PORTABLE CHEST 1 VIEW COMPARISON:  Chest radiograph 08/07/2015. FINDINGS: Monitoring leads overlie the patient. Enlarged cardiac and mediastinal contours. Low lung volumes. Basilar heterogeneous pulmonary opacities. No pleural effusion or pneumothorax. IMPRESSION: Low lung volumes with basilar atelectasis. Cardiac contours upper limits of normal. Electronically Signed   By: Annia Belt M.D.   On: 04/27/2017 16:37     ____________________________________________   PROCEDURES Procedures CRITICAL CARE Performed by: Scotty Court, [place] Gamero   Total critical care time: 35 minutes  Critical care time was exclusive of separately billable procedures and treating other patients.  Critical care was necessary to treat or prevent imminent or life-threatening deterioration.  Critical care was time spent personally by me on the following activities: development of treatment plan with patient and/or surrogate as well as nursing, discussions with consultants, evaluation of patient's response to treatment, examination of patient, obtaining history from patient or surrogate, ordering and performing treatments and interventions, ordering and review of laboratory studies, ordering and review of radiographic studies, pulse oximetry and re-evaluation of patient's condition.  ____________________________________________   INITIAL IMPRESSION /  ASSESSMENT AND PLAN / ED COURSE  Pertinent labs & imaging results that were available during my care of the patient were reviewed by me and considered in my medical decision making (see chart for details).    Clinical Course as of Apr 27 1726  Fri Apr 27, 2017  1622 P/w fever, AMS. Code sepsis. Ivf bolus, abx, sepsis workup.   [PS]    Clinical Course User Index [PS] Sharman Cheek, MD    ----------------------------------------- 5:27 PM on 04/27/2017 -----------------------------------------  Remains hemodynamically stable. Fever improving but still present. Workup reveals markedly leukocytosis, obvious urinary tract infection. No other infectious source identified. Low suspicion for meningitis or encephalitis. I will with hospitalist for admission.   ____________________________________________   FINAL CLINICAL IMPRESSION(S) / ED DIAGNOSES  Final diagnoses:  Altered mental status, unspecified altered mental status type  Sepsis, due to unspecified organism [facility])   Urinary tract infection without hematuria, site unspecified      New Prescriptions   No medications on file     Portions of this note were generated with dragon dictation software. Dictation errors may occur despite best attempts at proofreading.    Sharman Cheek, MD 04/27/17 1728

## 2017-04-27 NOTE — Progress Notes (Signed)
Pharmacy Antibiotic Note  George Hunt is a 83 y.o. male admitted on 04/27/2017 with sepsis.  Pharmacy has been consulted for vancomycin/piperacillin-tazobactam dosing.  Plan: Vancomycin 1000 mg IV x1 and piperacillin-tazobactam 3.375 g IV x1 given in the ED. Vancomycin 1000 mg IV every 24 hours.  Goal trough 15-20 mcg/mL. Zosyn 3.375g IV q8h (4 hour infusion).  Stacked dose= 6 hr Ke=0.043 T1/2=16.12 VD=49.49  Height: 5\' 9"  (175.3 cm) Weight: 180 lb (81.6 kg) IBW/kg (Calculated) : 70.7  Temp (24hrs), Avg:101.4 F (38.6 C), Min:100.4 F (38 C), Max:102.2 F (39 C)   Recent Labs Lab 04/27/17 1614 04/27/17 1615  WBC 16.4*  --   CREATININE 1.15  --   LATICACIDVEN  --  1.3    Estimated Creatinine Clearance: 47 mL/min (by C-G formula based on SCr of 1.15 mg/dL).    No Known Allergies  Antimicrobials this admission: Vancomycin 6/8 >> Zosyn 6/8 >>   Microbiology results: 6/8 BCx: sent 6/8 UCx: sent  Thank you for allowing pharmacy to be a part of this patient's care.  8/8, PharmD 04/27/2017 7:03 PM

## 2017-04-27 NOTE — H&P (Signed)
George Hunt is an 83 y.o. male.   Chief Complaint: Altered mental status HPI: the patient with past medical history of coronary artery disease status post MI presents emergency department with decreased alertness. The patient has had a decreased appetite for 2 days and has been less interactive today. When he seemed to be somewhat difficult to arouse his family called EMS for transportation to the hospital for evaluation. He was found to have urinary tract infectionand given IV antibiotics in the emergency department before the hospitalist service was called for admission.  Past Medical History:  Diagnosis Date  . MI, old     Past Surgical History:  Procedure Laterality Date  . HERNIA REPAIR      Family History  Problem Relation Age of Onset  . Hypertension Other    Social History:  reports that he has quit smoking. He has never used smokeless tobacco. He reports that he does not drink alcohol. His drug history is not on file.  Allergies: No Known Allergies  Medications Prior to Admission  Medication Sig Dispense Refill  . aspirin EC 81 MG tablet Take 81 mg by mouth daily.    . clopidogrel (PLAVIX) 75 MG tablet Take 75 mg by mouth daily.    . simvastatin (ZOCOR) 40 MG tablet Take 40 mg by mouth daily.    . pantoprazole (PROTONIX) 20 MG tablet Take 1 tablet (20 mg total) by mouth daily. 14 tablet 0    Results for orders placed or performed during the hospital encounter of 04/27/17 (from the past 48 hour(s))  Comprehensive metabolic panel     Status: Abnormal   Collection Time: 04/27/17  4:14 PM  Result Value Ref Range   Sodium 136 135 - 145 mmol/L   Potassium 3.8 3.5 - 5.1 mmol/L   Chloride 103 101 - 111 mmol/L   CO2 23 22 - 32 mmol/L   Glucose, Bld 150 (H) 65 - 99 mg/dL   BUN 22 (H) 6 - 20 mg/dL   Creatinine, Ser 1.15 0.61 - 1.24 mg/dL   Calcium 8.8 (L) 8.9 - 10.3 mg/dL   Total Protein 6.9 6.5 - 8.1 g/dL   Albumin 3.5 3.5 - 5.0 g/dL   AST 20 15 - 41 U/L   ALT 11 (L) 17  - 63 U/L   Alkaline Phosphatase 83 38 - 126 U/L   Total Bilirubin 2.4 (H) 0.3 - 1.2 mg/dL   GFR calc non Af Amer 56 (L) >60 mL/min   GFR calc Af Amer >60 >60 mL/min    Comment: (NOTE) The eGFR has been calculated using the CKD EPI equation. This calculation has not been validated in all clinical situations. eGFR's persistently <60 mL/min signify possible Chronic Kidney Disease.    Anion gap 10 5 - 15  Lipase, blood     Status: None   Collection Time: 04/27/17  4:14 PM  Result Value Ref Range   Lipase 22 11 - 51 U/L  Troponin I     Status: None   Collection Time: 04/27/17  4:14 PM  Result Value Ref Range   Troponin I <0.03 <0.03 ng/mL  CBC WITH DIFFERENTIAL     Status: Abnormal   Collection Time: 04/27/17  4:14 PM  Result Value Ref Range   WBC 16.4 (H) 3.8 - 10.6 K/uL   RBC 4.62 4.40 - 5.90 MIL/uL   Hemoglobin 13.5 13.0 - 18.0 g/dL   HCT 40.0 40.0 - 52.0 %   MCV 86.6 80.0 - 100.0  fL   MCH 29.1 26.0 - 34.0 pg   MCHC 33.7 32.0 - 36.0 g/dL   RDW 13.4 11.5 - 14.5 %   Platelets 132 (L) 150 - 440 K/uL   Neutrophils Relative % 88 %   Neutro Abs 14.6 (H) 1.4 - 6.5 K/uL   Lymphocytes Relative 3 %   Lymphs Abs 0.5 (L) 1.0 - 3.6 K/uL   Monocytes Relative 8 %   Monocytes Absolute 1.2 (H) 0.2 - 1.0 K/uL   Eosinophils Relative 0 %   Eosinophils Absolute 0.0 0 - 0.7 K/uL   Basophils Relative 1 %   Basophils Absolute 0.1 0 - 0.1 K/uL  APTT     Status: None   Collection Time: 04/27/17  4:14 PM  Result Value Ref Range   aPTT 34 24 - 36 seconds  Protime-INR     Status: Abnormal   Collection Time: 04/27/17  4:14 PM  Result Value Ref Range   Prothrombin Time 16.0 (H) 11.4 - 15.2 seconds   INR 1.27   Procalcitonin     Status: None   Collection Time: 04/27/17  4:14 PM  Result Value Ref Range   Procalcitonin 0.19 ng/mL    Comment:        Interpretation: PCT (Procalcitonin) <= 0.5 ng/mL: Systemic infection (sepsis) is not likely. Local bacterial infection is possible. (NOTE)          ICU PCT Algorithm               Non ICU PCT Algorithm    ----------------------------     ------------------------------         PCT < 0.25 ng/mL                 PCT < 0.1 ng/mL     Stopping of antibiotics            Stopping of antibiotics       strongly encouraged.               strongly encouraged.    ----------------------------     ------------------------------       PCT level decrease by               PCT < 0.25 ng/mL       >= 80% from peak PCT       OR PCT 0.25 - 0.5 ng/mL          Stopping of antibiotics                                             encouraged.     Stopping of antibiotics           encouraged.    ----------------------------     ------------------------------       PCT level decrease by              PCT >= 0.25 ng/mL       < 80% from peak PCT        AND PCT >= 0.5 ng/mL            Continuin g antibiotics  encouraged.       Continuing antibiotics            encouraged.    ----------------------------     ------------------------------     PCT level increase compared          PCT > 0.5 ng/mL         with peak PCT AND          PCT >= 0.5 ng/mL             Escalation of antibiotics                                          strongly encouraged.      Escalation of antibiotics        strongly encouraged.   TSH     Status: None   Collection Time: 04/27/17  4:14 PM  Result Value Ref Range   TSH 0.931 0.350 - 4.500 uIU/mL    Comment: Performed by a 3rd Generation assay with a functional sensitivity of <=0.01 uIU/mL.  Urinalysis, Complete w Microscopic     Status: Abnormal   Collection Time: 04/27/17  4:15 PM  Result Value Ref Range   Color, Urine AMBER (A) YELLOW    Comment: BIOCHEMICALS MAY BE AFFECTED BY COLOR   APPearance CLOUDY (A) CLEAR   Specific Gravity, Urine 1.024 1.005 - 1.030   pH 5.0 5.0 - 8.0   Glucose, UA NEGATIVE NEGATIVE mg/dL   Hgb urine dipstick MODERATE (A) NEGATIVE   Bilirubin Urine NEGATIVE  NEGATIVE   Ketones, ur 20 (A) NEGATIVE mg/dL   Protein, ur 100 (A) NEGATIVE mg/dL   Nitrite NEGATIVE NEGATIVE   Leukocytes, UA MODERATE (A) NEGATIVE   RBC / HPF 6-30 0 - 5 RBC/hpf   WBC, UA TOO NUMEROUS TO COUNT 0 - 5 WBC/hpf   Bacteria, UA MANY (A) NONE SEEN   Squamous Epithelial / LPF 0-5 (A) NONE SEEN   Mucous PRESENT   Lactic acid, plasma     Status: None   Collection Time: 04/27/17  4:15 PM  Result Value Ref Range   Lactic Acid, Venous 1.3 0.5 - 1.9 mmol/L   Dg Chest Port 1 View  Result Date: 04/27/2017 CLINICAL DATA:  Altered mental status. EXAM: PORTABLE CHEST 1 VIEW COMPARISON:  Chest radiograph 08/07/2015. FINDINGS: Monitoring leads overlie the patient. Enlarged cardiac and mediastinal contours. Low lung volumes. Basilar heterogeneous pulmonary opacities. No pleural effusion or pneumothorax. IMPRESSION: Low lung volumes with basilar atelectasis. Cardiac contours upper limits of normal. Electronically Signed   By: Lovey Newcomer M.D.   On: 04/27/2017 16:37    Review of Systems  Constitutional: Negative for chills and fever.  HENT: Negative for sore throat and tinnitus.   Eyes: Negative for blurred vision and redness.  Respiratory: Negative for cough and shortness of breath.   Cardiovascular: Negative for chest pain, palpitations, orthopnea and PND.  Gastrointestinal: Negative for abdominal pain, diarrhea, nausea and vomiting.  Genitourinary: Negative for dysuria, frequency and urgency.  Musculoskeletal: Negative for joint pain and myalgias.  Skin: Negative for rash.       No lesions  Neurological: Positive for weakness. Negative for speech change and focal weakness.  Endo/Heme/Allergies: Does not bruise/bleed easily.       No temperature intolerance  Psychiatric/Behavioral: Negative for depression and suicidal ideas.    Blood pressure (!) 129/53, pulse 66, temperature  98.6 F (37 C), temperature source Oral, resp. rate (!) 21, height 5' 9"  (1.753 m), weight 80.2 kg (176 lb  14.4 oz), SpO2 98 %. Physical Exam  Constitutional: He is oriented to person, place, and time. He appears well-developed and well-nourished. No distress.  HENT:  Head: Normocephalic and atraumatic.  Mouth/Throat: Oropharynx is clear and moist.  Eyes: Conjunctivae and EOM are normal. Pupils are equal, round, and reactive to light. No scleral icterus.  Neck: Normal range of motion. Neck supple. No JVD present. No tracheal deviation present. No thyromegaly present.  Cardiovascular: Normal rate, regular rhythm and normal heart sounds.  Exam reveals no gallop and no friction rub.   No murmur heard. Respiratory: Effort normal and breath sounds normal. No respiratory distress.  GI: Soft. Bowel sounds are normal. He exhibits no distension. There is no tenderness.  Genitourinary:  Genitourinary Comments: Deferred  Musculoskeletal: Normal range of motion. He exhibits no edema.  Lymphadenopathy:    He has no cervical adenopathy.  Neurological: He is alert and oriented to person, place, and time. No cranial nerve deficit.  Skin: Skin is warm and dry. No rash noted. No erythema.  Psychiatric: He has a normal mood and affect. His behavior is normal. Judgment and thought content normal.     Assessment/Plan This is an 83 year old male admitted for sepsis. 1. Sepsis: The patient meets criteria via leukocytosis, fever and intermittent tachypnea. Source is likely urine. Blood and urine cultures have been obtained. Follow for growth and sensitivities. Continue broad-spectrum antibiotics. 2. UTI: Present on admission. Continue vancomycin and Zosyn 3. Coronary artery disease: Stable; continue aspirin and Plavix 4. Hyperlipidemia: Continue statin therapy 5. DVT prophylaxis: Lovenox 6. GI prophylaxis: None The patient is a full code. Time spent on admission orders and patient care approximately 45 minutes  Harrie Foreman, MD 04/27/2017, 10:48 PM

## 2017-04-27 NOTE — ED Notes (Signed)
Pt's grandson at bedside states pt is somewhat confused at baseline and does not speak much at home. States pt appears to be at baseline mental status at this time.

## 2017-04-27 NOTE — ED Triage Notes (Signed)
Pt presents to ED via [facility] from home c/o AMS per family. EMS report temp 101.7, strong urine odor, and new onset urinary incontinence per family, given 320mg  tylenol and 400mg  ibuprofen PTA. Pt states no c/o pain, does not know why he is at the hospital.

## 2017-04-27 NOTE — ED Notes (Signed)
Floor RN unable to take report at this time. Will call back.

## 2017-04-28 MED ORDER — DEXTROSE 5 % IV SOLN
1.0000 g | INTRAVENOUS | Status: AC
  Administered 2017-04-28 – 2017-04-29 (×2): 1 g via INTRAVENOUS
  Filled 2017-04-28 (×2): qty 10

## 2017-04-28 NOTE — Plan of Care (Signed)
Problem: Safety: Goal: Ability to remain free from injury will improve Outcome: Progressing Pt has remained free from injury. Room changed so that pt can be closer to the nursing station.   Problem: Pain Managment: Goal: General experience of comfort will improve Outcome: Progressing Pt has no complaints of pain at this time.   Problem: Skin Integrity: Goal: Risk for impaired skin integrity will decrease Outcome: Progressing No new skin breakdown noted.  Problem: Activity: Goal: Risk for activity intolerance will decrease Outcome: Progressing Pt ambulates generally well with minimal assistance from nursing staff.

## 2017-04-28 NOTE — Progress Notes (Signed)
Pharmacy Antibiotic Note  George Hunt is a 83 y.o. male admitted on 04/27/2017 with UTI.  Pharmacy has been consulted for ceftriaxone dosing.  Plan: Ceftriaxone 1 g IV q24h  Height: 5\' 9"  (175.3 cm) Weight: 176 lb 14.4 oz (80.2 kg) IBW/kg (Calculated) : 70.7  Temp (24hrs), Avg:100.5 F (38.1 C), Min:97.9 F (36.6 C), Max:102.2 F (39 C)   Recent Labs Lab 04/27/17 1614 04/27/17 1615  WBC 16.4*  --   CREATININE 1.15  --   LATICACIDVEN  --  1.3    Estimated Creatinine Clearance: 47 mL/min (by C-G formula based on SCr of 1.15 mg/dL).    No Known Allergies  Antimicrobials this admission: Vanc/Zosyn 6/8 >> 6/9 Ceftriaxone 6/9 >>  Dose adjustments this admission:   Microbiology results:  BCx: NGTD  UCx: pending    Thank you for allowing pharmacy to be a part of this patient's care.  8/9 04/28/2017 10:12 AM

## 2017-04-28 NOTE — Plan of Care (Signed)
Problem: Education: Goal: Knowledge of [facility] Education information/materials will improve Outcome: Progressing Patient and Family updated on plan of care with patient's permission. Family in visiting with patient today.   Problem: Safety: Goal: Ability to remain free from injury will improve Outcome: Progressing Patient impulsive, bed alarm on- patient remained free from injury this shift  Problem: Health Behavior/Discharge Planning: Goal: Ability to manage health-related needs will improve Outcome: Progressing Patient states that he is feeling better.   Problem: Pain Managment: Goal: General experience of comfort will improve Outcome: Progressing Patient denies any pain this shift.   Problem: Physical Regulation: Goal: Ability to maintain clinical measurements within normal limits will improve Vital signs stable Goal: Will remain free from infection Outcome: Progressing No new signs or symptoms of infection noted.   Problem: Skin Integrity: Goal: Risk for impaired skin integrity will decrease Patient able to turn and reposition self. No new skin breakdown noted.

## 2017-04-28 NOTE — Progress Notes (Signed)
Patient ID: George Hunt, male   DOB: 06/14/1930, 83 y.o.   MRN: [id number]  [facility] PROGRESS NOTE  JOSEPHMICHAEL LISENBEE TAV:[id number] DOB: March 13, 1930 DOA: 04/27/2017 PCP: Marina Goodell, MD  HPI/Subjective: Patient feels fine and doesn't know why he is in the hospital.  Objective: Vitals:   04/28/17 0519 04/28/17 1307  BP: 106/71 (!) 115/52  Pulse: 71 81  Resp: 20 18  Temp: 97.9 F (36.6 C) 98.5 F (36.9 C)    Filed Weights   04/27/17 1612 04/27/17 2052  Weight: 81.6 kg (180 lb) 80.2 kg (176 lb 14.4 oz)    ROS: Review of Systems  Constitutional: Negative for chills and fever.  Eyes: Negative for blurred vision.  Respiratory: Negative for cough and shortness of breath.   Cardiovascular: Negative for chest pain.  Gastrointestinal: Negative for abdominal pain, constipation, diarrhea, nausea and vomiting.  Genitourinary: Negative for dysuria.  Musculoskeletal: Negative for joint pain.  Neurological: Negative for dizziness and headaches.   Exam: Physical Exam  Constitutional: He is oriented to person, place, and time.  HENT:  Nose: No mucosal edema.  Mouth/Throat: No oropharyngeal exudate or posterior oropharyngeal edema.  Eyes: Conjunctivae, EOM and lids are normal. Pupils are equal, round, and reactive to light.  Neck: No JVD present. Carotid bruit is not present. No edema present. No thyroid mass and no thyromegaly present.  Cardiovascular: S1 normal and S2 normal.  Exam reveals no gallop.   No murmur heard. Pulses:      Dorsalis pedis pulses are 2+ on the right side, and 2+ on the left side.  Respiratory: No respiratory distress. He has no wheezes. He has no rhonchi. He has no rales.  GI: Soft. Bowel sounds are normal. There is no tenderness.  Musculoskeletal:       Right ankle: He exhibits no swelling.       Left ankle: He exhibits no swelling.  Lymphadenopathy:    He has no cervical adenopathy.  Neurological: He is alert and oriented to person, place, and  time. No cranial nerve deficit.  Skin: Skin is warm. No rash noted. Nails show no clubbing.  Psychiatric: He has a normal mood and affect.      Data Reviewed: Basic Metabolic Panel:  Recent Labs Lab 04/27/17 1614  NA 136  K 3.8  CL 103  CO2 23  GLUCOSE 150*  BUN 22*  CREATININE 1.15  CALCIUM 8.8*   Liver Function Tests:  Recent Labs Lab 04/27/17 1614  AST 20  ALT 11*  ALKPHOS 83  BILITOT 2.4*  PROT 6.9  ALBUMIN 3.5    Recent Labs Lab 04/27/17 1614  LIPASE 22   CBC:  Recent Labs Lab 04/27/17 1614  WBC 16.4*  NEUTROABS 14.6*  HGB 13.5  HCT 40.0  MCV 86.6  PLT 132*   Cardiac Enzymes:  Recent Labs Lab 04/27/17 1614  TROPONINI <0.03     Recent Results (from the past 240 hour(s))  Blood Culture (routine x 2)     Status: None (Preliminary result)   Collection Time: 04/27/17  4:14 PM  Result Value Ref Range Status   Specimen Description BLOOD LEFT ASSIST CONTROL  Final   Special Requests   Final    BOTTLES DRAWN AEROBIC AND ANAEROBIC Blood Culture adequate volume   Culture NO GROWTH < 12 HOURS  Final   Report Status PENDING  Incomplete  Blood Culture (routine x 2)     Status: None (Preliminary result)   Collection Time: 04/27/17  4:14 PM  Result Value Ref Range Status   Specimen Description BLOOD RIGHT ARM  Final   Special Requests   Final    BOTTLES DRAWN AEROBIC AND ANAEROBIC Blood Culture adequate volume   Culture NO GROWTH < 12 HOURS  Final   Report Status PENDING  Incomplete     Studies: Dg Chest Port 1 View  Result Date: 04/27/2017 CLINICAL DATA:  Altered mental status. EXAM: PORTABLE CHEST 1 VIEW COMPARISON:  Chest radiograph 08/07/2015. FINDINGS: Monitoring leads overlie the patient. Enlarged cardiac and mediastinal contours. Low lung volumes. Basilar heterogeneous pulmonary opacities. No pleural effusion or pneumothorax. IMPRESSION: Low lung volumes with basilar atelectasis. Cardiac contours upper limits of normal. Electronically  Signed   By: Annia Belt M.D.   On: 04/27/2017 16:37    Scheduled Meds: . aspirin EC  81 mg Oral Daily  . clopidogrel  75 mg Oral Daily  . docusate sodium  100 mg Oral BID  . enoxaparin (LOVENOX) injection  40 mg Subcutaneous Q24H  . simvastatin  40 mg Oral Daily   Continuous Infusions: . sodium chloride 40 mL/hr at 04/28/17 1104  . cefTRIAXone (ROCEPHIN) 1 g IVPB Stopped (04/28/17 1404)    Assessment/Plan:  1. Clinical sepsis present on admission, acute cystitis with hematuria, fever, leukocytosis and tachypnea. Patient was given aggressive antibiotics with vancomycin and Zosyn. Since we are treating urinary tract infection I will switch antibiotics over to Rocephin. Follow-up blood cultures and urine cultures. So far blood cultures are negative and urine culture still pending. Decrease rate of IV fluids. 2. Acute encephalopathy secondary to sepsis. Seems improved today. 3. History of coronary artery disease on aspirin and Plavix 4. Hyperlipidemia unspecified on simvastatin 5. Physical therapy evaluation  Code Status:     Code Status Orders        Start     Ordered   04/27/17 1952  Full code  Continuous     04/27/17 1951    Code Status History    Date Active Date Inactive Code Status Order ID Comments User Context   This patient has a current code status but no historical code status.     Family Communication: Spoke with wife on the phone Disposition Plan: Patient wanted to go home today but I will hopefully send him home tomorrow  Antibiotics:  Rocephin  Time spent: 28 minutes  Alford Highland  [employer]

## 2017-04-29 LAB — HEMOGLOBIN A1C
Hgb A1c MFr Bld: 5.7 % — ABNORMAL HIGH (ref 4.8–5.6)
Mean Plasma Glucose: 117 mg/dL

## 2017-04-29 LAB — CBC
HEMATOCRIT: 33.6 % — AB (ref 40.0–52.0)
HEMOGLOBIN: 11.4 g/dL — AB (ref 13.0–18.0)
MCH: 29.7 pg (ref 26.0–34.0)
MCHC: 33.9 g/dL (ref 32.0–36.0)
MCV: 87.7 fL (ref 80.0–100.0)
Platelets: 118 10*3/uL — ABNORMAL LOW (ref 150–440)
RBC: 3.83 MIL/uL — ABNORMAL LOW (ref 4.40–5.90)
RDW: 13.8 % (ref 11.5–14.5)
WBC: 9.6 10*3/uL (ref 3.8–10.6)

## 2017-04-29 LAB — BASIC METABOLIC PANEL
ANION GAP: 5 (ref 5–15)
BUN: 19 mg/dL (ref 6–20)
CHLORIDE: 109 mmol/L (ref 101–111)
CO2: 24 mmol/L (ref 22–32)
Calcium: 8.2 mg/dL — ABNORMAL LOW (ref 8.9–10.3)
Creatinine, Ser: 1.05 mg/dL (ref 0.61–1.24)
GFR calc non Af Amer: >60 mL/min (ref >60)
Glucose, Bld: 99 mg/dL (ref 65–99)
POTASSIUM: 3.4 mmol/L — AB (ref 3.5–5.1)
Sodium: 138 mmol/L (ref 135–145)

## 2017-04-29 MED ORDER — TAMSULOSIN HCL 0.4 MG PO CAPS: 0.4000 mg | ORAL_CAPSULE | Freq: Every day | ORAL | 0 refills | Status: AC

## 2017-04-29 MED ORDER — CEPHALEXIN 500 MG PO CAPS: 500.0000 mg | ORAL_CAPSULE | Freq: Three times a day (TID) | ORAL | Status: DC

## 2017-04-29 MED ORDER — CEPHALEXIN 250 MG PO CAPS: 250.0000 mg | ORAL_CAPSULE | Freq: Three times a day (TID) | ORAL | 0 refills | Status: DC

## 2017-04-29 MED ORDER — TAMSULOSIN HCL 0.4 MG PO CAPS: 0.4000 mg | ORAL_CAPSULE | Freq: Every day | ORAL | Status: DC

## 2017-04-29 NOTE — Discharge Summary (Signed)
[facility] - [place] [facility]   PATIENT NAME: George Hunt    MR#:  [id number]  DATE OF BIRTH:  [birth year]  DATE OF ADMISSION:  04/27/2017 ADMITTING PHYSICIAN: Arnaldo Natal, MD  DATE OF DISCHARGE: 04/29/2017  PRIMARY CARE PHYSICIAN: Marina Goodell, MD    ADMISSION DIAGNOSIS:  Sepsis, due to unspecified organism (HCC) [A41.9] Urinary tract infection without hematuria, site unspecified [N39.0] Altered mental status, unspecified altered mental status type [R41.82]  DISCHARGE DIAGNOSIS:  Active Problems:   Sepsis (HCC)   SECONDARY DIAGNOSIS:   Past Medical History:  Diagnosis Date  . MI, old     HOSPITAL COURSE:   1.  Clinical sepsis present on admission, acute cystitis with hematuria, fever, leukocytosis and tachypnea. Patient was initially started on aggressive antibiotics with vancomycin and Zosyn. Since we are treating urinary tract infection I switched the antibiotics over to Rocephin. I did give a dose of Rocephin IV prior to disposition. Blood cultures are negative to date. Urine culture growing Escherichia coli and sensitivities will be back tomorrow. I switched the patient over to Keflex upon discharge home. I also started Flomax for BPH 2. Acute encephalopathy secondary to sepsis. Seems improved.  3. History of coronary artery disease on aspirin and Plavix 4. Hyperlipidemia unspecified on simvastatin  DISCHARGE CONDITIONS:   Satisfactory  CONSULTS OBTAINED:   none  DRUG ALLERGIES:  No Known Allergies  DISCHARGE MEDICATIONS:   Current Discharge Medication List    START taking these medications   Details  cephALEXin (KEFLEX) 250 MG capsule Take 1 capsule (250 mg total) by mouth 3 (three) times daily. Qty: 12 capsule, Refills: 0    tamsulosin (FLOMAX) 0.4 MG CAPS capsule Take 1 capsule (0.4 mg total) by mouth at bedtime. Qty: 30 capsule, Refills: 0      CONTINUE these medications which have NOT CHANGED   Details  aspirin  EC 81 MG tablet Take 81 mg by mouth daily.    clopidogrel (PLAVIX) 75 MG tablet Take 75 mg by mouth daily.    simvastatin (ZOCOR) 40 MG tablet Take 40 mg by mouth daily.    pantoprazole (PROTONIX) 20 MG tablet Take 1 tablet (20 mg total) by mouth daily. Qty: 14 tablet, Refills: 0         DISCHARGE INSTRUCTIONS:   Follow-up PMD one week  If you experience worsening of your admission symptoms, develop shortness of breath, life threatening emergency, suicidal or homicidal thoughts you must seek medical attention immediately by calling 911 or calling your MD immediately  if symptoms less severe.  You Must read complete instructions/literature along with all the possible adverse reactions/side effects for all the Medicines you take and that have been prescribed to you. Take any new Medicines after you have completely understood and accept all the possible adverse reactions/side effects.   Please note  You were cared for by a hospitalist during your hospital stay. If you have any questions about your discharge medications or the care you received while you were in the hospital after you are discharged, you can call the unit and asked to speak with the hospitalist on call if the hospitalist that took care of you is not available. Once you are discharged, your primary care physician will handle any further medical issues. Please note that NO REFILLS for any discharge medications will be authorized once you are discharged, as it is imperative that you return to your primary care physician (or establish a relationship with a primary care  physician if you do not have one) for your aftercare needs so that they can reassess your need for medications and monitor your lab values.    Today   CHIEF COMPLAINT:   Chief Complaint  Patient presents with  . Altered Mental Status    HISTORY OF PRESENT ILLNESS:  George Hunt  is a 83 y.o. male brought in with altered mental status and found to have  clinical sepsis secondary to urinary tract infection   VITAL SIGNS:  Blood pressure 124/73, pulse 71, temperature 98.7 F (37.1 C), temperature source Oral, resp. rate 18, height 5\' 9"  (1.753 m), weight 80.2 kg (176 lb 11.2 oz), SpO2 96 %.   PHYSICAL EXAMINATION:  GENERAL:  83 y.o.-year-old patient lying in the bed with no acute distress.  EYES: Pupils equal, round, reactive to light and accommodation. No scleral icterus. Extraocular muscles intact.  HEENT: Head atraumatic, normocephalic. Oropharynx and nasopharynx clear.  NECK:  Supple, no jugular venous distention. No thyroid enlargement, no tenderness.  LUNGS: Normal breath sounds bilaterally, no wheezing, rales,rhonchi or crepitation. No use of accessory muscles of respiration.  CARDIOVASCULAR: S1, S2 normal. No murmurs, rubs, or gallops.  ABDOMEN: Soft, non-tender, non-distended. Bowel sounds present. No organomegaly or mass.  EXTREMITIES: No pedal edema, cyanosis, or clubbing.  NEUROLOGIC: Cranial nerves II through XII are intact. Muscle strength 5/5 in all extremities. Sensation intact. Gait not checked.  PSYCHIATRIC: The patient is alert and oriented x 3.  SKIN: No obvious rash, lesion, or ulcer.   DATA REVIEW:   CBC  Recent Labs Lab 04/29/17 0614  WBC 9.6  HGB 11.4*  HCT 33.6*  PLT 118*    Chemistries   Recent Labs Lab 04/27/17 1614 04/29/17 0614  NA 136 138  K 3.8 3.4*  CL 103 109  CO2 23 24  GLUCOSE 150* 99  BUN 22* 19  CREATININE 1.15 1.05  CALCIUM 8.8* 8.2*  AST 20  --   ALT 11*  --   ALKPHOS 83  --   BILITOT 2.4*  --     Cardiac Enzymes  Recent Labs Lab 04/27/17 1614  TROPONINI <0.03    Microbiology Results  Results for orders placed or performed during the hospital encounter of 04/27/17  Blood Culture (routine x 2)     Status: None (Preliminary result)   Collection Time: 04/27/17  4:14 PM  Result Value Ref Range Status   Specimen Description BLOOD LEFT ASSIST CONTROL  Final   Special  Requests   Final    BOTTLES DRAWN AEROBIC AND ANAEROBIC Blood Culture adequate volume   Culture NO GROWTH 2 DAYS  Final   Report Status PENDING  Incomplete  Blood Culture (routine x 2)     Status: None (Preliminary result)   Collection Time: 04/27/17  4:14 PM  Result Value Ref Range Status   Specimen Description BLOOD RIGHT ARM  Final   Special Requests   Final    BOTTLES DRAWN AEROBIC AND ANAEROBIC Blood Culture adequate volume   Culture NO GROWTH 2 DAYS  Final   Report Status PENDING  Incomplete  Urine culture     Status: Abnormal (Preliminary result)   Collection Time: 04/27/17  4:15 PM  Result Value Ref Range Status   Specimen Description URINE, RANDOM  Final   Special Requests NONE  Final   Culture (A)  Final    >=100,000 COLONIES/mL ESCHERICHIA COLI SUSCEPTIBILITIES TO FOLLOW Performed at [facility], [address] [place], [place] [address]  Report Status PENDING  Incomplete    RADIOLOGY:  Dg Chest Port 1 View  Result Date: 04/27/2017 CLINICAL DATA:  Altered mental status. EXAM: PORTABLE CHEST 1 VIEW COMPARISON:  Chest radiograph 08/07/2015. FINDINGS: Monitoring leads overlie the patient. Enlarged cardiac and mediastinal contours. Low lung volumes. Basilar heterogeneous pulmonary opacities. No pleural effusion or pneumothorax. IMPRESSION: Low lung volumes with basilar atelectasis. Cardiac contours upper limits of normal. Electronically Signed   By: Annia Belt M.D.   On: 04/27/2017 16:37    Management plans discussed with the patient, family and they are in agreement.  CODE STATUS:     Code Status Orders        Start     Ordered   04/27/17 1952  Full code  Continuous     04/27/17 1951    Code Status History    Date Active Date Inactive Code Status Order ID Comments User Context   This patient has a current code status but no historical code status.      TOTAL TIME TAKING CARE OF THIS PATIENT: 35 minutes.    Alford Highland M.D on 04/29/2017 at  1:26 PM  Between 7am to 6pm - Pager - [phone]  After 6pm go to [email or web address] - password [email or web address]  [facility] Office  [phone]  CC: Primary care physician; Marina Goodell, MD

## 2017-04-29 NOTE — Progress Notes (Signed)
Pt is being discharged home. Discharge papers given and explained to pt and spouse. Spouse verbalized understanding. Meds and f/u appointment reviewed with spouse. RX given.

## 2017-04-29 NOTE — Evaluation (Signed)
Physical Therapy Evaluation Patient Details Name: George Hunt MRN: [id number] DOB: [birth year] Today's Date: 04/29/2017   History of Present Illness  Pt is an 83 y.o. male presenting to hospital with AMS and fever.  Pt admitted to hospital with sepsis and UTI.  PMH includes MI and hernia repair.  Clinical Impression  Prior to hospital admission, pt was independent (occasional use of SPC for balance).  Pt lives with his wife and daughter in 1 level home with ramp to enter with railings.  Currently pt is modified independent with bed mobility, SBA with transfers, and CGA (which improved to SBA) with ambulation around nursing loop without an assistive device.  Pt demonstrating mild unsteadiness occasionally when looking R and L while ambulating but no overt loss of balance noted (and pt able to self correct); pt's daughter and wife report this is pt's baseline.  Pt has had 2 falls in past 6 months and both had stairs involved (pt encouraged to use ramp to enter/exit home).  Pt also encouraged to use SPC for added balance support when ambulating.  Pt would benefit from skilled PT to address noted impairments and functional limitations during hospital stay and prevent loss of strength, balance, and mobility during hospitalization (see below for any additional details).  Upon hospital discharge, recommend pt discharge to home with support of family.  No further PT recommended upon hospital discharge (pt appears close to baseline).    Follow Up Recommendations No PT follow up    Equipment Recommendations  Cane (pt has SPC at home already)    Recommendations for Other Services       Precautions / Restrictions Precautions Precautions: Fall Restrictions Weight Bearing Restrictions: No      Mobility  Bed Mobility Overal bed mobility: Modified Independent             General bed mobility comments: Supine to/from sit with HOB elevated without difficulties  Transfers Overall transfer level:  Needs assistance Equipment used: None Transfers: Sit to/from [employer] Transfers Sit to Stand: Supervision Stand pivot transfers: Supervision       General transfer comment: strong stand; steady without loss of balance  Ambulation/Gait Ambulation/Gait assistance: Min guard;Supervision Ambulation Distance (Feet): 190 Feet Assistive device: None Gait Pattern/deviations: Step-through pattern   Gait velocity interpretation: at or above normal speed for age/gender General Gait Details: mild unsteadiness occasionally looking R and L while ambulating but no overt loss of balance noted requiring assist to steady  Stairs Stairs:  (Deferred d/t pt has ramped entrance at home)          Wheelchair Mobility    Modified Rankin (Stroke Patients Only)       Balance Overall balance assessment: History of Falls;Needs assistance Sitting-balance support: No upper extremity supported;Feet supported Sitting balance-Leahy Scale: Normal Sitting balance - Comments: sitting reaching outside BOS   Standing balance support: No upper extremity supported;During functional activity Standing balance-Leahy Scale: Good Standing balance comment: overall good balance with ambulation except with head turns (see gait section above)                             Pertinent Vitals/Pain Pain Assessment: No/denies pain  Vitals (HR and O2 on room air) stable and WFL throughout treatment session.    Home Living Family/patient expects to be discharged to:: Private residence Living Arrangements: Spouse/significant other;Children (Pt's wife and daughter) Available Help at Discharge: Family Type of Home: House Home Access: Ramped entrance (  4 steps in front but pt has ramped entrance through garage with B rails)     Home Layout: One level Home Equipment: Shower seat - built in;Cane - single point      Prior Function Level of Independence: Independent with assistive device(s)          Comments: Pt occasionally uses SPC.  2 falls in past 6 months (dog pulled pt down front stairs and other time pt returned from getting mail and fell on steps)     Hand Dominance        Extremity/Trunk Assessment   Upper Extremity Assessment Upper Extremity Assessment: Overall WFL for tasks assessed    Lower Extremity Assessment Lower Extremity Assessment: Overall WFL for tasks assessed    Cervical / Trunk Assessment Cervical / Trunk Assessment: Normal  Communication   Communication: HOH  Cognition Arousal/Alertness: Awake/alert Behavior During Therapy:  (Eager to discharge from hospital) Overall Cognitive Status:  (Oriented to self and place)                                        General Comments General comments (skin integrity, edema, etc.): Pt's wife and daughter present during session.  Nursing cleared pt for participation in physical therapy.  Pt agreeable to PT session.    Exercises     Assessment/Plan    PT Assessment Patient needs continued PT services  PT Problem List Decreased balance       PT Treatment Interventions DME instruction;Gait training;Balance training;Therapeutic activities;Therapeutic exercise;Functional mobility training;Patient/family education    PT Goals (Current goals can be found in the Care Plan section)  Acute Rehab PT Goals Patient Stated Goal: to go home PT Goal Formulation: With patient/family Time For Goal Achievement: 05/13/17 Potential to Achieve Goals: Good    Frequency Min 2X/week   Barriers to discharge        Co-evaluation               AM-PAC PT "6 Clicks" Daily Activity  Outcome Measure Difficulty turning over in bed (including adjusting bedclothes, sheets and blankets)?: None Difficulty moving from lying on back to sitting on the side of the bed? : None Difficulty sitting down on and standing up from a chair with arms (e.g., wheelchair, bedside commode, etc,.)?: None Help needed moving  to and from a bed to chair (including a wheelchair)?: A Little Help needed walking in hospital room?: A Little Help needed climbing 3-5 steps with a railing? : A Little 6 Click Score: 21    End of Session Equipment Utilized During Treatment: Gait belt Activity Tolerance: Patient tolerated treatment well Patient left: in bed;with call bell/phone within reach;with bed alarm set;with family/visitor present Nurse Communication: Mobility status;Precautions PT Visit Diagnosis: Unsteadiness on feet (R26.81);History of falling (Z91.81)    Time: 6294-7654 PT Time Calculation (min) (ACUTE ONLY): 15 min   Charges:   PT Evaluation $PT Eval Low Complexity: 1 Procedure     PT G CodesHendricks Limes, PT 04/29/17, 9:37 AM [phone]

## 2017-04-30 LAB — URINE CULTURE: Culture: >=100000 — AB

## 2017-05-02 LAB — CULTURE, BLOOD (ROUTINE X 2)
CULTURE: NO GROWTH
Culture: NO GROWTH
SPECIAL REQUESTS: ADEQUATE
Special Requests: ADEQUATE

## 2017-05-07 DIAGNOSIS — N401 Enlarged prostate with lower urinary tract symptoms: Secondary | ICD-10-CM | POA: Diagnosis not present

## 2017-05-07 DIAGNOSIS — R351 Nocturia: Secondary | ICD-10-CM | POA: Diagnosis not present

## 2017-05-07 DIAGNOSIS — A419 Sepsis, unspecified organism: Secondary | ICD-10-CM | POA: Diagnosis not present

## 2017-05-07 DIAGNOSIS — N390 Urinary tract infection, site not specified: Secondary | ICD-10-CM | POA: Diagnosis not present

## 2017-05-24 DIAGNOSIS — I679 Cerebrovascular disease, unspecified: Secondary | ICD-10-CM | POA: Diagnosis not present

## 2017-05-24 DIAGNOSIS — R413 Other amnesia: Secondary | ICD-10-CM | POA: Diagnosis not present

## 2017-05-24 DIAGNOSIS — E782 Mixed hyperlipidemia: Secondary | ICD-10-CM | POA: Diagnosis not present

## 2017-05-24 DIAGNOSIS — R351 Nocturia: Secondary | ICD-10-CM | POA: Diagnosis not present

## 2017-05-24 DIAGNOSIS — K219 Gastro-esophageal reflux disease without esophagitis: Secondary | ICD-10-CM | POA: Diagnosis not present

## 2017-05-24 DIAGNOSIS — N401 Enlarged prostate with lower urinary tract symptoms: Secondary | ICD-10-CM | POA: Diagnosis not present

## 2017-05-24 DIAGNOSIS — I10 Essential (primary) hypertension: Secondary | ICD-10-CM | POA: Diagnosis not present

## 2017-05-24 DIAGNOSIS — G629 Polyneuropathy, unspecified: Secondary | ICD-10-CM | POA: Diagnosis not present

## 2017-06-06 DIAGNOSIS — R739 Hyperglycemia, unspecified: Secondary | ICD-10-CM | POA: Diagnosis not present

## 2017-11-26 DIAGNOSIS — E782 Mixed hyperlipidemia: Secondary | ICD-10-CM | POA: Diagnosis not present

## 2017-11-26 DIAGNOSIS — G629 Polyneuropathy, unspecified: Secondary | ICD-10-CM | POA: Diagnosis not present

## 2017-11-26 DIAGNOSIS — R7302 Impaired glucose tolerance (oral): Secondary | ICD-10-CM | POA: Diagnosis not present

## 2017-11-26 DIAGNOSIS — R413 Other amnesia: Secondary | ICD-10-CM | POA: Diagnosis not present

## 2017-11-26 DIAGNOSIS — N401 Enlarged prostate with lower urinary tract symptoms: Secondary | ICD-10-CM | POA: Diagnosis not present

## 2017-11-26 DIAGNOSIS — I679 Cerebrovascular disease, unspecified: Secondary | ICD-10-CM | POA: Diagnosis not present

## 2017-11-26 DIAGNOSIS — I10 Essential (primary) hypertension: Secondary | ICD-10-CM | POA: Diagnosis not present

## 2017-11-26 DIAGNOSIS — Z0000 Encounter for general adult medical examination without abnormal findings: Secondary | ICD-10-CM | POA: Diagnosis not present

## 2017-11-26 DIAGNOSIS — R351 Nocturia: Secondary | ICD-10-CM | POA: Diagnosis not present

## 2017-11-26 DIAGNOSIS — K219 Gastro-esophageal reflux disease without esophagitis: Secondary | ICD-10-CM | POA: Diagnosis not present

## 2017-11-30 DIAGNOSIS — Z961 Presence of intraocular lens: Secondary | ICD-10-CM | POA: Diagnosis not present

## 2018-05-31 DIAGNOSIS — G629 Polyneuropathy, unspecified: Secondary | ICD-10-CM | POA: Diagnosis not present

## 2018-05-31 DIAGNOSIS — I10 Essential (primary) hypertension: Secondary | ICD-10-CM | POA: Diagnosis not present

## 2018-05-31 DIAGNOSIS — R2681 Unsteadiness on feet: Secondary | ICD-10-CM | POA: Diagnosis not present

## 2018-05-31 DIAGNOSIS — R351 Nocturia: Secondary | ICD-10-CM | POA: Diagnosis not present

## 2018-05-31 DIAGNOSIS — R413 Other amnesia: Secondary | ICD-10-CM | POA: Diagnosis not present

## 2018-05-31 DIAGNOSIS — N401 Enlarged prostate with lower urinary tract symptoms: Secondary | ICD-10-CM | POA: Diagnosis not present

## 2018-05-31 DIAGNOSIS — E782 Mixed hyperlipidemia: Secondary | ICD-10-CM | POA: Diagnosis not present

## 2018-05-31 DIAGNOSIS — I679 Cerebrovascular disease, unspecified: Secondary | ICD-10-CM | POA: Diagnosis not present

## 2018-05-31 DIAGNOSIS — R7302 Impaired glucose tolerance (oral): Secondary | ICD-10-CM | POA: Diagnosis not present

## 2018-12-04 DIAGNOSIS — I10 Essential (primary) hypertension: Secondary | ICD-10-CM | POA: Diagnosis not present

## 2018-12-04 DIAGNOSIS — E119 Type 2 diabetes mellitus without complications: Secondary | ICD-10-CM | POA: Diagnosis not present

## 2018-12-04 DIAGNOSIS — I679 Cerebrovascular disease, unspecified: Secondary | ICD-10-CM | POA: Diagnosis not present

## 2018-12-04 DIAGNOSIS — G629 Polyneuropathy, unspecified: Secondary | ICD-10-CM | POA: Diagnosis not present

## 2018-12-04 DIAGNOSIS — R413 Other amnesia: Secondary | ICD-10-CM | POA: Diagnosis not present

## 2018-12-04 DIAGNOSIS — R351 Nocturia: Secondary | ICD-10-CM | POA: Diagnosis not present

## 2018-12-04 DIAGNOSIS — E782 Mixed hyperlipidemia: Secondary | ICD-10-CM | POA: Diagnosis not present

## 2018-12-04 DIAGNOSIS — N401 Enlarged prostate with lower urinary tract symptoms: Secondary | ICD-10-CM | POA: Diagnosis not present

## 2019-01-29 DIAGNOSIS — R531 Weakness: Secondary | ICD-10-CM | POA: Diagnosis not present

## 2019-01-29 DIAGNOSIS — K2900 Acute gastritis without bleeding: Secondary | ICD-10-CM | POA: Diagnosis not present

## 2019-01-29 DIAGNOSIS — R1013 Epigastric pain: Secondary | ICD-10-CM | POA: Diagnosis not present

## 2019-01-29 DIAGNOSIS — R0789 Other chest pain: Secondary | ICD-10-CM | POA: Diagnosis not present

## 2019-02-07 DIAGNOSIS — I499 Cardiac arrhythmia, unspecified: Secondary | ICD-10-CM | POA: Diagnosis not present

## 2019-02-07 DIAGNOSIS — I10 Essential (primary) hypertension: Secondary | ICD-10-CM | POA: Diagnosis not present

## 2019-02-07 DIAGNOSIS — I25110 Atherosclerotic heart disease of native coronary artery with unstable angina pectoris: Secondary | ICD-10-CM | POA: Diagnosis not present

## 2019-02-07 DIAGNOSIS — I679 Cerebrovascular disease, unspecified: Secondary | ICD-10-CM | POA: Diagnosis not present

## 2019-02-18 DIAGNOSIS — I499 Cardiac arrhythmia, unspecified: Secondary | ICD-10-CM | POA: Diagnosis not present

## 2019-02-20 DIAGNOSIS — I499 Cardiac arrhythmia, unspecified: Secondary | ICD-10-CM | POA: Diagnosis not present

## 2019-02-20 DIAGNOSIS — E876 Hypokalemia: Secondary | ICD-10-CM | POA: Diagnosis not present

## 2019-02-20 DIAGNOSIS — F0390 Unspecified dementia without behavioral disturbance: Secondary | ICD-10-CM | POA: Diagnosis not present

## 2019-02-20 DIAGNOSIS — I10 Essential (primary) hypertension: Secondary | ICD-10-CM | POA: Diagnosis not present

## 2019-02-20 DIAGNOSIS — M069 Rheumatoid arthritis, unspecified: Secondary | ICD-10-CM | POA: Diagnosis not present

## 2019-02-20 DIAGNOSIS — S73005A Unspecified dislocation of left hip, initial encounter: Secondary | ICD-10-CM | POA: Diagnosis not present

## 2019-03-05 DIAGNOSIS — I679 Cerebrovascular disease, unspecified: Secondary | ICD-10-CM | POA: Diagnosis not present

## 2019-03-05 DIAGNOSIS — I25110 Atherosclerotic heart disease of native coronary artery with unstable angina pectoris: Secondary | ICD-10-CM | POA: Diagnosis not present

## 2019-03-05 DIAGNOSIS — I10 Essential (primary) hypertension: Secondary | ICD-10-CM | POA: Diagnosis not present

## 2019-05-20 DIAGNOSIS — G629 Polyneuropathy, unspecified: Secondary | ICD-10-CM | POA: Diagnosis not present

## 2019-05-20 DIAGNOSIS — I10 Essential (primary) hypertension: Secondary | ICD-10-CM | POA: Diagnosis not present

## 2019-05-20 DIAGNOSIS — E782 Mixed hyperlipidemia: Secondary | ICD-10-CM | POA: Diagnosis not present

## 2019-05-20 DIAGNOSIS — E119 Type 2 diabetes mellitus without complications: Secondary | ICD-10-CM | POA: Diagnosis not present

## 2019-05-20 DIAGNOSIS — R351 Nocturia: Secondary | ICD-10-CM | POA: Diagnosis not present

## 2019-05-20 DIAGNOSIS — N401 Enlarged prostate with lower urinary tract symptoms: Secondary | ICD-10-CM | POA: Diagnosis not present

## 2019-05-20 DIAGNOSIS — R413 Other amnesia: Secondary | ICD-10-CM | POA: Diagnosis not present

## 2019-05-20 DIAGNOSIS — I679 Cerebrovascular disease, unspecified: Secondary | ICD-10-CM | POA: Diagnosis not present

## 2019-05-20 DIAGNOSIS — Z0000 Encounter for general adult medical examination without abnormal findings: Secondary | ICD-10-CM | POA: Diagnosis not present

## 2019-09-03 DIAGNOSIS — R413 Other amnesia: Secondary | ICD-10-CM | POA: Diagnosis not present

## 2019-12-19 DIAGNOSIS — E782 Mixed hyperlipidemia: Secondary | ICD-10-CM | POA: Diagnosis not present

## 2019-12-19 DIAGNOSIS — I679 Cerebrovascular disease, unspecified: Secondary | ICD-10-CM | POA: Diagnosis not present

## 2019-12-19 DIAGNOSIS — R413 Other amnesia: Secondary | ICD-10-CM | POA: Diagnosis not present

## 2019-12-19 DIAGNOSIS — N401 Enlarged prostate with lower urinary tract symptoms: Secondary | ICD-10-CM | POA: Diagnosis not present

## 2019-12-19 DIAGNOSIS — I10 Essential (primary) hypertension: Secondary | ICD-10-CM | POA: Diagnosis not present

## 2019-12-19 DIAGNOSIS — G629 Polyneuropathy, unspecified: Secondary | ICD-10-CM | POA: Diagnosis not present

## 2019-12-19 DIAGNOSIS — E119 Type 2 diabetes mellitus without complications: Secondary | ICD-10-CM | POA: Diagnosis not present

## 2019-12-19 DIAGNOSIS — R351 Nocturia: Secondary | ICD-10-CM | POA: Diagnosis not present

## 2019-12-22 DIAGNOSIS — E782 Mixed hyperlipidemia: Secondary | ICD-10-CM | POA: Diagnosis not present

## 2019-12-22 DIAGNOSIS — E119 Type 2 diabetes mellitus without complications: Secondary | ICD-10-CM | POA: Diagnosis not present

## 2020-06-21 DIAGNOSIS — R351 Nocturia: Secondary | ICD-10-CM | POA: Diagnosis not present

## 2020-06-21 DIAGNOSIS — E119 Type 2 diabetes mellitus without complications: Secondary | ICD-10-CM | POA: Diagnosis not present

## 2020-06-21 DIAGNOSIS — Z0000 Encounter for general adult medical examination without abnormal findings: Secondary | ICD-10-CM | POA: Diagnosis not present

## 2020-06-21 DIAGNOSIS — I10 Essential (primary) hypertension: Secondary | ICD-10-CM | POA: Diagnosis not present

## 2020-06-21 DIAGNOSIS — N401 Enlarged prostate with lower urinary tract symptoms: Secondary | ICD-10-CM | POA: Diagnosis not present

## 2020-06-21 DIAGNOSIS — R413 Other amnesia: Secondary | ICD-10-CM | POA: Diagnosis not present

## 2020-06-21 DIAGNOSIS — G629 Polyneuropathy, unspecified: Secondary | ICD-10-CM | POA: Diagnosis not present

## 2020-06-21 DIAGNOSIS — I679 Cerebrovascular disease, unspecified: Secondary | ICD-10-CM | POA: Diagnosis not present

## 2020-06-21 DIAGNOSIS — E782 Mixed hyperlipidemia: Secondary | ICD-10-CM | POA: Diagnosis not present

## 2020-06-22 ENCOUNTER — Emergency Department: Payer: PPO

## 2020-06-22 ENCOUNTER — Emergency Department
Admission: EM | Admit: 2020-06-22 | Discharge: 2020-06-22 | Disposition: A | Discharge status: Home / Self Care | Payer: PPO | Attending: Emergency Medicine | Admitting: Emergency Medicine

## 2020-06-22 ENCOUNTER — Encounter: Payer: Self-pay | Admitting: Intensive Care

## 2020-06-22 ENCOUNTER — Other Ambulatory Visit: Payer: Self-pay

## 2020-06-22 DIAGNOSIS — S02402A Zygomatic fracture, unspecified, initial encounter for closed fracture: Secondary | ICD-10-CM | POA: Insufficient documentation

## 2020-06-22 DIAGNOSIS — Z7401 Bed confinement status: Secondary | ICD-10-CM | POA: Diagnosis not present

## 2020-06-22 DIAGNOSIS — S299XXA Unspecified injury of thorax, initial encounter: Secondary | ICD-10-CM | POA: Diagnosis not present

## 2020-06-22 DIAGNOSIS — N200 Calculus of kidney: Secondary | ICD-10-CM | POA: Diagnosis not present

## 2020-06-22 DIAGNOSIS — Z87891 Personal history of nicotine dependence: Secondary | ICD-10-CM | POA: Insufficient documentation

## 2020-06-22 DIAGNOSIS — R4182 Altered mental status, unspecified: Secondary | ICD-10-CM | POA: Diagnosis not present

## 2020-06-22 DIAGNOSIS — Z7189 Other specified counseling: Secondary | ICD-10-CM | POA: Diagnosis not present

## 2020-06-22 DIAGNOSIS — Z7982 Long term (current) use of aspirin: Secondary | ICD-10-CM | POA: Insufficient documentation

## 2020-06-22 DIAGNOSIS — R402362 Coma scale, best motor response, obeys commands, at arrival to emergency department: Secondary | ICD-10-CM | POA: Diagnosis not present

## 2020-06-22 DIAGNOSIS — K449 Diaphragmatic hernia without obstruction or gangrene: Secondary | ICD-10-CM | POA: Diagnosis not present

## 2020-06-22 DIAGNOSIS — I6529 Occlusion and stenosis of unspecified carotid artery: Secondary | ICD-10-CM | POA: Diagnosis not present

## 2020-06-22 DIAGNOSIS — S2241XA Multiple fractures of ribs, right side, initial encounter for closed fracture: Secondary | ICD-10-CM | POA: Diagnosis not present

## 2020-06-22 DIAGNOSIS — S02832A Fracture of medial orbital wall, left side, initial encounter for closed fracture: Secondary | ICD-10-CM | POA: Diagnosis not present

## 2020-06-22 DIAGNOSIS — F0391 Unspecified dementia with behavioral disturbance: Secondary | ICD-10-CM | POA: Diagnosis not present

## 2020-06-22 DIAGNOSIS — J90 Pleural effusion, not elsewhere classified: Secondary | ICD-10-CM | POA: Diagnosis not present

## 2020-06-22 DIAGNOSIS — R296 Repeated falls: Secondary | ICD-10-CM | POA: Diagnosis not present

## 2020-06-22 DIAGNOSIS — Z23 Encounter for immunization: Secondary | ICD-10-CM | POA: Diagnosis not present

## 2020-06-22 DIAGNOSIS — I700 Atherosclerosis of aorta: Secondary | ICD-10-CM | POA: Diagnosis not present

## 2020-06-22 DIAGNOSIS — F0150 Vascular dementia without behavioral disturbance: Secondary | ICD-10-CM | POA: Diagnosis not present

## 2020-06-22 DIAGNOSIS — S0232XA Fracture of orbital floor, left side, initial encounter for closed fracture: Secondary | ICD-10-CM | POA: Diagnosis not present

## 2020-06-22 DIAGNOSIS — E785 Hyperlipidemia, unspecified: Secondary | ICD-10-CM | POA: Diagnosis not present

## 2020-06-22 DIAGNOSIS — S3993XA Unspecified injury of pelvis, initial encounter: Secondary | ICD-10-CM | POA: Diagnosis not present

## 2020-06-22 DIAGNOSIS — I82512 Chronic embolism and thrombosis of left femoral vein: Secondary | ICD-10-CM | POA: Diagnosis not present

## 2020-06-22 DIAGNOSIS — R402122 Coma scale, eyes open, to pain, at arrival to emergency department: Secondary | ICD-10-CM | POA: Diagnosis not present

## 2020-06-22 DIAGNOSIS — N400 Enlarged prostate without lower urinary tract symptoms: Secondary | ICD-10-CM | POA: Diagnosis not present

## 2020-06-22 DIAGNOSIS — I2693 Single subsegmental pulmonary embolism without acute cor pulmonale: Secondary | ICD-10-CM | POA: Diagnosis not present

## 2020-06-22 DIAGNOSIS — S06360A Traumatic hemorrhage of cerebrum, unspecified, without loss of consciousness, initial encounter: Secondary | ICD-10-CM | POA: Diagnosis not present

## 2020-06-22 DIAGNOSIS — Y999 Unspecified external cause status: Secondary | ICD-10-CM | POA: Diagnosis not present

## 2020-06-22 DIAGNOSIS — S06369A Traumatic hemorrhage of cerebrum, unspecified, with loss of consciousness of unspecified duration, initial encounter: Secondary | ICD-10-CM | POA: Insufficient documentation

## 2020-06-22 DIAGNOSIS — S0240DA Maxillary fracture, left side, initial encounter for closed fracture: Secondary | ICD-10-CM

## 2020-06-22 DIAGNOSIS — S02401A Maxillary fracture, unspecified, initial encounter for closed fracture: Secondary | ICD-10-CM | POA: Diagnosis not present

## 2020-06-22 DIAGNOSIS — R402252 Coma scale, best verbal response, oriented, at arrival to emergency department: Secondary | ICD-10-CM | POA: Diagnosis not present

## 2020-06-22 DIAGNOSIS — Y929 Unspecified place or not applicable: Secondary | ICD-10-CM | POA: Insufficient documentation

## 2020-06-22 DIAGNOSIS — Y92008 Other place in unspecified non-institutional (private) residence as the place of occurrence of the external cause: Secondary | ICD-10-CM | POA: Diagnosis not present

## 2020-06-22 DIAGNOSIS — R5381 Other malaise: Secondary | ICD-10-CM | POA: Diagnosis not present

## 2020-06-22 DIAGNOSIS — R489 Unspecified symbolic dysfunctions: Secondary | ICD-10-CM | POA: Diagnosis not present

## 2020-06-22 DIAGNOSIS — N39498 Other specified urinary incontinence: Secondary | ICD-10-CM | POA: Diagnosis not present

## 2020-06-22 DIAGNOSIS — S02842A Fracture of lateral orbital wall, left side, initial encounter for closed fracture: Secondary | ICD-10-CM

## 2020-06-22 DIAGNOSIS — W19XXXA Unspecified fall, initial encounter: Secondary | ICD-10-CM | POA: Diagnosis not present

## 2020-06-22 DIAGNOSIS — I2699 Other pulmonary embolism without acute cor pulmonale: Secondary | ICD-10-CM | POA: Diagnosis not present

## 2020-06-22 DIAGNOSIS — Z86711 Personal history of pulmonary embolism: Secondary | ICD-10-CM | POA: Diagnosis not present

## 2020-06-22 DIAGNOSIS — G8911 Acute pain due to trauma: Secondary | ICD-10-CM | POA: Diagnosis not present

## 2020-06-22 DIAGNOSIS — S0240FA Zygomatic fracture, left side, initial encounter for closed fracture: Secondary | ICD-10-CM

## 2020-06-22 DIAGNOSIS — E1140 Type 2 diabetes mellitus with diabetic neuropathy, unspecified: Secondary | ICD-10-CM | POA: Diagnosis not present

## 2020-06-22 DIAGNOSIS — Y33XXXA Other specified events, undetermined intent, initial encounter: Secondary | ICD-10-CM | POA: Diagnosis not present

## 2020-06-22 DIAGNOSIS — I82412 Acute embolism and thrombosis of left femoral vein: Secondary | ICD-10-CM | POA: Diagnosis not present

## 2020-06-22 DIAGNOSIS — I615 Nontraumatic intracerebral hemorrhage, intraventricular: Secondary | ICD-10-CM | POA: Diagnosis not present

## 2020-06-22 DIAGNOSIS — M9971 Connective tissue and disc stenosis of intervertebral foramina of cervical region: Secondary | ICD-10-CM | POA: Diagnosis not present

## 2020-06-22 DIAGNOSIS — N401 Enlarged prostate with lower urinary tract symptoms: Secondary | ICD-10-CM | POA: Diagnosis not present

## 2020-06-22 DIAGNOSIS — R404 Transient alteration of awareness: Secondary | ICD-10-CM | POA: Diagnosis not present

## 2020-06-22 DIAGNOSIS — K8020 Calculus of gallbladder without cholecystitis without obstruction: Secondary | ICD-10-CM | POA: Diagnosis not present

## 2020-06-22 DIAGNOSIS — R52 Pain, unspecified: Secondary | ICD-10-CM | POA: Diagnosis not present

## 2020-06-22 DIAGNOSIS — S0512XA Contusion of eyeball and orbital tissues, left eye, initial encounter: Secondary | ICD-10-CM | POA: Diagnosis not present

## 2020-06-22 DIAGNOSIS — R918 Other nonspecific abnormal finding of lung field: Secondary | ICD-10-CM | POA: Diagnosis not present

## 2020-06-22 DIAGNOSIS — M47812 Spondylosis without myelopathy or radiculopathy, cervical region: Secondary | ICD-10-CM | POA: Diagnosis not present

## 2020-06-22 DIAGNOSIS — S3991XA Unspecified injury of abdomen, initial encounter: Secondary | ICD-10-CM | POA: Diagnosis not present

## 2020-06-22 DIAGNOSIS — Z515 Encounter for palliative care: Secondary | ICD-10-CM | POA: Diagnosis not present

## 2020-06-22 DIAGNOSIS — Z20822 Contact with and (suspected) exposure to covid-19: Secondary | ICD-10-CM | POA: Insufficient documentation

## 2020-06-22 DIAGNOSIS — W010XXA Fall on same level from slipping, tripping and stumbling without subsequent striking against object, initial encounter: Secondary | ICD-10-CM | POA: Diagnosis not present

## 2020-06-22 DIAGNOSIS — I313 Pericardial effusion (noninflammatory): Secondary | ICD-10-CM | POA: Diagnosis not present

## 2020-06-22 DIAGNOSIS — F329 Major depressive disorder, single episode, unspecified: Secondary | ICD-10-CM | POA: Diagnosis not present

## 2020-06-22 DIAGNOSIS — J45909 Unspecified asthma, uncomplicated: Secondary | ICD-10-CM | POA: Diagnosis not present

## 2020-06-22 DIAGNOSIS — G9389 Other specified disorders of brain: Secondary | ICD-10-CM | POA: Diagnosis not present

## 2020-06-22 DIAGNOSIS — F05 Delirium due to known physiological condition: Secondary | ICD-10-CM | POA: Diagnosis not present

## 2020-06-22 DIAGNOSIS — M47816 Spondylosis without myelopathy or radiculopathy, lumbar region: Secondary | ICD-10-CM | POA: Diagnosis not present

## 2020-06-22 DIAGNOSIS — I2510 Atherosclerotic heart disease of native coronary artery without angina pectoris: Secondary | ICD-10-CM | POA: Diagnosis not present

## 2020-06-22 DIAGNOSIS — I6389 Other cerebral infarction: Secondary | ICD-10-CM | POA: Diagnosis not present

## 2020-06-22 DIAGNOSIS — I351 Nonrheumatic aortic (valve) insufficiency: Secondary | ICD-10-CM | POA: Diagnosis not present

## 2020-06-22 DIAGNOSIS — S3992XA Unspecified injury of lower back, initial encounter: Secondary | ICD-10-CM | POA: Diagnosis not present

## 2020-06-22 DIAGNOSIS — M47814 Spondylosis without myelopathy or radiculopathy, thoracic region: Secondary | ICD-10-CM | POA: Diagnosis not present

## 2020-06-22 DIAGNOSIS — Y939 Activity, unspecified: Secondary | ICD-10-CM | POA: Diagnosis not present

## 2020-06-22 DIAGNOSIS — S0592XA Unspecified injury of left eye and orbit, initial encounter: Secondary | ICD-10-CM | POA: Diagnosis present

## 2020-06-22 DIAGNOSIS — S06359A Traumatic hemorrhage of left cerebrum with loss of consciousness of unspecified duration, initial encounter: Secondary | ICD-10-CM | POA: Diagnosis not present

## 2020-06-22 DIAGNOSIS — S199XXA Unspecified injury of neck, initial encounter: Secondary | ICD-10-CM | POA: Diagnosis not present

## 2020-06-22 DIAGNOSIS — I10 Essential (primary) hypertension: Secondary | ICD-10-CM | POA: Diagnosis not present

## 2020-06-22 DIAGNOSIS — R9431 Abnormal electrocardiogram [ECG] [EKG]: Secondary | ICD-10-CM | POA: Diagnosis not present

## 2020-06-22 DIAGNOSIS — S0285XA Fracture of orbit, unspecified, initial encounter for closed fracture: Secondary | ICD-10-CM | POA: Diagnosis not present

## 2020-06-22 DIAGNOSIS — R58 Hemorrhage, not elsewhere classified: Secondary | ICD-10-CM | POA: Diagnosis not present

## 2020-06-22 DIAGNOSIS — I340 Nonrheumatic mitral (valve) insufficiency: Secondary | ICD-10-CM | POA: Diagnosis not present

## 2020-06-22 DIAGNOSIS — S0012XA Contusion of left eyelid and periocular area, initial encounter: Secondary | ICD-10-CM | POA: Diagnosis not present

## 2020-06-22 DIAGNOSIS — I714 Abdominal aortic aneurysm, without rupture: Secondary | ICD-10-CM | POA: Diagnosis not present

## 2020-06-22 DIAGNOSIS — W19XXXD Unspecified fall, subsequent encounter: Secondary | ICD-10-CM | POA: Diagnosis not present

## 2020-06-22 DIAGNOSIS — Z79899 Other long term (current) drug therapy: Secondary | ICD-10-CM | POA: Diagnosis not present

## 2020-06-22 HISTORY — DX: Hyperlipidemia, unspecified: E78.5

## 2020-06-22 LAB — PROTIME-INR
INR: 1.1 (ref 0.8–1.2)
Prothrombin Time: 13.4 seconds (ref 11.4–15.2)

## 2020-06-22 LAB — COMPREHENSIVE METABOLIC PANEL
ALT: 10 U/L (ref 0–44)
AST: 17 U/L (ref 15–41)
Albumin: 3.6 g/dL (ref 3.5–5.0)
Alkaline Phosphatase: 62 U/L (ref 38–126)
Anion gap: 9 (ref 5–15)
BUN: 23 mg/dL (ref 8–23)
CO2: 25 mmol/L (ref 22–32)
Calcium: 8.6 mg/dL — ABNORMAL LOW (ref 8.9–10.3)
Chloride: 105 mmol/L (ref 98–111)
Creatinine, Ser: 1.26 mg/dL — ABNORMAL HIGH (ref 0.61–1.24)
GFR calc Af Amer: 59 mL/min — ABNORMAL LOW (ref >60)
GFR calc non Af Amer: 51 mL/min — ABNORMAL LOW (ref >60)
Glucose, Bld: 162 mg/dL — ABNORMAL HIGH (ref 70–99)
Potassium: 4 mmol/L (ref 3.5–5.1)
Sodium: 139 mmol/L (ref 135–145)
Total Bilirubin: 1.4 mg/dL — ABNORMAL HIGH (ref 0.3–1.2)
Total Protein: 6.5 g/dL (ref 6.5–8.1)

## 2020-06-22 LAB — CBC WITH DIFFERENTIAL/PLATELET
Abs Immature Granulocytes: 0.09 10*3/uL — ABNORMAL HIGH (ref 0.00–0.07)
Basophils Absolute: 0 10*3/uL (ref 0.0–0.1)
Basophils Relative: 0 %
Eosinophils Absolute: 0.1 10*3/uL (ref 0.0–0.5)
Eosinophils Relative: 1 %
HCT: 39 % (ref 39.0–52.0)
Hemoglobin: 12.7 g/dL — ABNORMAL LOW (ref 13.0–17.0)
Immature Granulocytes: 1 %
Lymphocytes Relative: 6 %
Lymphs Abs: 0.6 10*3/uL — ABNORMAL LOW (ref 0.7–4.0)
MCH: 29.9 pg (ref 26.0–34.0)
MCHC: 32.6 g/dL (ref 30.0–36.0)
MCV: 91.8 fL (ref 80.0–100.0)
Monocytes Absolute: 0.6 10*3/uL (ref 0.1–1.0)
Monocytes Relative: 6 %
Neutro Abs: 8.6 10*3/uL — ABNORMAL HIGH (ref 1.7–7.7)
Neutrophils Relative %: 86 %
Platelets: 128 10*3/uL — ABNORMAL LOW (ref 150–400)
RBC: 4.25 MIL/uL (ref 4.22–5.81)
RDW: 13.2 % (ref 11.5–15.5)
WBC: 10 10*3/uL (ref 4.0–10.5)
nRBC: 0 % (ref 0.0–0.2)

## 2020-06-22 LAB — ETHANOL: Alcohol, Ethyl (B): <10 mg/dL (ref <10)

## 2020-06-22 LAB — APTT: aPTT: 29 seconds (ref 24–36)

## 2020-06-22 LAB — TROPONIN I (HIGH SENSITIVITY): Troponin I (High Sensitivity): 6 ng/L (ref <18)

## 2020-06-22 LAB — SARS CORONAVIRUS 2 BY RT PCR (HOSPITAL ORDER, PERFORMED IN ~~LOC~~ HOSPITAL LAB): SARS Coronavirus 2: NEGATIVE

## 2020-06-22 MED ORDER — TETANUS-DIPHTH-ACELL PERTUSSIS 5-2.5-18.5 LF-MCG/0.5 IM SUSP
0.5000 mL | Freq: Once | INTRAMUSCULAR | Status: AC
  Administered 2020-06-22: 0.5 mL via INTRAMUSCULAR
  Filled 2020-06-22: qty 0.5

## 2020-06-22 MED ORDER — ONDANSETRON HCL 4 MG/2ML IJ SOLN
4.0000 mg | Freq: Once | INTRAMUSCULAR | Status: AC
  Administered 2020-06-22: 4 mg via INTRAVENOUS
  Filled 2020-06-22: qty 2

## 2020-06-22 MED ORDER — FENTANYL CITRATE (PF) 100 MCG/2ML IJ SOLN
50.0000 ug | Freq: Once | INTRAMUSCULAR | Status: AC
  Administered 2020-06-22: 50 ug via INTRAVENOUS
  Filled 2020-06-22: qty 2

## 2020-06-22 MED ORDER — IOHEXOL 300 MG/ML  SOLN
100.0000 mL | Freq: Once | INTRAMUSCULAR | Status: AC | PRN
  Administered 2020-06-22: 100 mL via INTRAVENOUS

## 2020-06-22 NOTE — ED Triage Notes (Addendum)
Patients wife reports hearing patient get up from the other room and heard a thump. Wife reports patient hit head on dresser. Patient has large hematoma to left side of face. Patients wife reports confusion 2-3 years but the patient always refused his referral by PCP to be tested for dementia/alzheimers. Wife is unsure if patient takes blood thinners. Patient is A&D X4 and wife reports this is normal for patient

## 2020-06-22 NOTE — Progress Notes (Signed)
   06/22/20 0245  Clinical Encounter Type  Visited With Patient and family together  Visit Type Initial;Spiritual support;Social support  Referral From Nurse  Consult/Referral To Chaplain  Ch visited Pt and wife per request from nurse. I enter the room. Wife was very upset. I tried to bring a calming presence to the situation. I ask Pt wife how long had they been married. She said fifty-years. This seem to bring a smile to her face. The Dr. York Spaniel they were going transfer Pt to [facility]. Pt wife asked me to pray. I prayed for Pt and wife. The nurses allowed their son to come into the room because of the situation. I went to the waiting room and brought him back. Ch will-follow up with Pt.

## 2020-06-22 NOTE — ED Notes (Signed)
See triage note, pt with unwitnessed fall this morning, wife heard a thump. Pt with hematoma around left eye, blood around face, appears to be coming from around left eye and nose, bleeding controlled at this time. Pt not talkative, wife reports this is normal, dementia at baseline. Pt oriented to person, responsive to verbal stimuli.  Follows commands intermittently.  Wife at bedside Unsure of blood thinner use Collar placed on pt per Dr Katrinka Blazing.  Pt cleaned of urine incontinence and peri care provided.  RR even and unlabored

## 2020-06-22 NOTE — ED Triage Notes (Signed)
Pt comes into the ED via EMS from home with fall in the BR this morning with dementia at baseline, pt has hematoma to the left brow, epitaxis with controlled bleeding, CBG 128, VSS

## 2020-06-22 NOTE — ED Notes (Signed)
Report given to [employer]

## 2020-06-22 NOTE — ED Provider Notes (Addendum)
[facility] Emergency Department Provider Note  ____________________________________________   First MD Initiated Contact with Patient 06/22/20 1421     (approximate)  I have reviewed the triage vital signs and the nursing notes.   HISTORY  Chief Complaint No chief complaint on file.   HPI RUTILIO YELLOWHAIR is a 86 y.o. male with a past medical history of HTN, HDL, DM, BPH, and progressive memory loss although no formal diagnosis of dementia who presents via EMS from home with concerns for a fall.  Patient is unable provide any history arrival secondary to some confusion and altered mental status.  Per his wife was at bedside patient is not typically oriented to date or year but does seem more confused after she heard a thump and found him lying on the floor earlier this morning.  Patient otherwise has not had any other recent falls or injuries and has not had any recent fevers, cough, vomiting, diarrhea, or other acute sick concerns per wife.  No other history is immediately available arrival.         Past Medical History:  Diagnosis Date  . Hyperlipidemia   . MI, old     Patient Active Problem List   Diagnosis Date Noted  . Sepsis (HCC) 04/27/2017  . TOBACCO ABUSE 02/05/2011  . ABNORMAL ELECTROCARDIOGRAM 02/05/2011  . PERS HX NONCOMPLIANCE W/MED TX PRS HAZARDS HLTH 02/05/2011    Past Surgical History:  Procedure Laterality Date  . HERNIA REPAIR      Prior to Admission medications   Medication Sig Start Date End Date Taking? Authorizing Provider  aspirin EC 81 MG tablet Take 81 mg by mouth daily.   Yes [provider]  Cholecalciferol 25 MCG (1000 UT) capsule Take by mouth.   Yes [provider]  clopidogrel (PLAVIX) 75 MG tablet Take 75 mg by mouth daily.   Yes [provider]  simvastatin (ZOCOR) 40 MG tablet Take 40 mg by mouth daily.   Yes [provider]  tamsulosin (FLOMAX) 0.4 MG CAPS capsule Take 1  capsule (0.4 mg total) by mouth at bedtime. 04/29/17  Yes Alford Highland, MD    Allergies Patient has no known allergies.  Family History  Problem Relation Age of Onset  . Hypertension Other     Social History Social History   Tobacco Use  . Smoking status: Former Smoker    Types: Cigarettes  . Smokeless tobacco: Former Neurosurgeon    Types: Chew  Substance Use Topics  . Alcohol use: No  . Drug use: Never    Review of Systems  Review of Systems  Unable to perform ROS: Mental status change      ____________________________________________   PHYSICAL EXAM:  VITAL SIGNS: ED Triage Vitals  Enc Vitals Group     BP 06/22/20 1318 (!) 152/69     Pulse Rate 06/22/20 1318 64     Resp 06/22/20 1318 16     Temp 06/22/20 1318 97.7 F (36.5 C)     Temp Source 06/22/20 1318 Oral     SpO2 06/22/20 1318 97 %     Weight 06/22/20 1319 161 lb (73 kg)     Height 06/22/20 1319 5\' 9"  (1.753 m)     Head Circumference --      Peak Flow --      Pain Score --      Pain Loc --      Pain Edu? --      Excl. in  GC? --    Vitals:   06/22/20 1430 06/22/20 1536  BP: (!) 167/95 (!) 167/95  Pulse:  79  Resp: (!) 24 16  Temp:  97.7 F (36.5 C)  SpO2:  93%   Physical Exam Vitals and nursing note reviewed.  Constitutional:      General: He is in acute distress.     Appearance: He is well-developed. He is ill-appearing.  HENT:     Head: Normocephalic. Left periorbital erythema present.     Comments: Large left periorbital hematoma.    Right Ear: External ear normal.     Left Ear: External ear normal.     Mouth/Throat:     Mouth: Mucous membranes are moist.  Eyes:     Conjunctiva/sclera: Conjunctivae normal.  Cardiovascular:     Rate and Rhythm: Normal rate and regular rhythm.     Pulses: Normal pulses.     Heart sounds: No murmur heard.   Pulmonary:     Effort: Pulmonary effort is normal. No respiratory distress.     Breath sounds: Normal breath sounds.  Abdominal:      Palpations: Abdomen is soft.     Tenderness: There is no abdominal tenderness.  Musculoskeletal:     Cervical back: Neck supple.  Skin:    General: Skin is warm and dry.     Capillary Refill: Capillary refill takes less than 2 seconds.  Neurological:     Mental Status: He is alert. He is disoriented.     No step-off Or deformities over the cervical, thoracic spinous processes.  Patient is able to follow commands all extremities he does not participate in other formal neuro exam.  His right eye has extraocular movements intact.  His right pupil is reactive to light.  Patient does have some bloody sputum in his mouth there is no obvious source of bleeding on exam of the oropharynx.  On attempted exam of left pupil patient repeatedly pushes this examiner's hands of the way.  Patient does have a small linear hemostatic laceration over the left anterior temporal area. ____________________________________________   LABS (all labs ordered are listed, but only abnormal results are displayed)  Labs Reviewed  CBC WITH DIFFERENTIAL/PLATELET - Abnormal; Notable for the following components:      Result Value   Hemoglobin 12.7 (*)    Platelets 128 (*)    Neutro Abs 8.6 (*)    Lymphs Abs 0.6 (*)    Abs Immature Granulocytes 0.09 (*)    All other components within normal limits  COMPREHENSIVE METABOLIC PANEL - Abnormal; Notable for the following components:   Glucose, Bld 162 (*)    Creatinine, Ser 1.26 (*)    Calcium 8.6 (*)    Total Bilirubin 1.4 (*)    GFR calc non Af Amer 51 (*)    GFR calc Af Amer 59 (*)    All other components within normal limits  SARS CORONAVIRUS 2 BY RT PCR (HOSPITAL ORDER, PERFORMED IN [facility])  PROTIME-INR  APTT  ETHANOL  TYPE AND SCREEN  TROPONIN I (HIGH SENSITIVITY)   ____________________________________________  EKG  Sinus rhythm with normal axis, unremarkable intervals, rate of 65, no evidence of acute ischemia or other significant  arrhythmia. ____________________________________________  RADIOLOGY   Official radiology report(s): DG Chest 1 View  Result Date: 06/22/2020 CLINICAL DATA:  Fall. EXAM: CHEST  1 VIEW COMPARISON:  None. FINDINGS: The heart size and mediastinal contours are within normal limits. Both lungs are clear. No pneumothorax  or pleural effusion is noted. The visualized skeletal structures are unremarkable. IMPRESSION: No active disease. Electronically Signed   By: Lupita Raider M.D.   On: 06/22/2020 14:53   CT Head Wo Contrast  Result Date: 06/22/2020 CLINICAL DATA:  Fall EXAM: CT HEAD WITHOUT CONTRAST TECHNIQUE: Contiguous axial images were obtained from the base of the skull through the vertex without intravenous contrast. COMPARISON:  2014 FINDINGS: Brain: There is acute hemorrhage within the lateral ventricles, left greater than right. A punctate focus of hyperdensity in the right frontal white matter (series 4, image 19) probably reflects mineralization. No acute appearing loss of gray-white differentiation. There are small chronic cortical infarcts of the left frontoparietal lobes. Left larger than right chronic cerebellar infarcts are present. Additional patchy and confluent areas of hypoattenuation in the supratentorial white matter are nonspecific but probably reflect moderate chronic microvascular ischemic changes. There is no extra-axial fluid collection. Prominence of the ventricles and sulci reflects generalized parenchymal volume loss. Vascular: There is atherosclerotic calcification at the skull base. Skull: Refer to maxillofacial CT. Sinuses/Orbits: Refer to maxillofacial CT. Other: Left frontal scalp hematoma. IMPRESSION: Small to moderate volume acute hemorrhage within the lateral ventricles. Punctate focus of hyperdensity in the right frontal white matter likely reflects mineralization although an additional focus of hemorrhage is not excluded. Chronic/nonemergent findings detailed above. These  results were called by telephone at the time of interpretation on 06/22/2020 at 2:46 pm to provider [facility] , who verbally acknowledged these results. Electronically Signed   By: Guadlupe Spanish M.D.   On: 06/22/2020 14:48   CT Cervical Spine Wo Contrast  Result Date: 06/22/2020 CLINICAL DATA:  Fall EXAM: CT CERVICAL SPINE WITHOUT CONTRAST TECHNIQUE: Multidetector CT imaging of the cervical spine was performed without intravenous contrast. Multiplanar CT image reconstructions were also generated. COMPARISON:  None. FINDINGS: Alignment: No significant listhesis. Skull base and vertebrae: No acute cervical spine fracture. Soft tissues and spinal canal: No prevertebral fluid or swelling. No visible canal hematoma. Disc levels: Multilevel degenerative changes are present including disc space narrowing, endplate osteophytes, and facet and uncovertebral hypertrophy. Upper chest: Refer to separate CT of the chest. Other: Calcified plaque at the common carotid bifurcations. IMPRESSION: No acute cervical spine fracture. Electronically Signed   By: Guadlupe Spanish M.D.   On: 06/22/2020 14:51   DG Pelvis Portable  Result Date: 06/22/2020 CLINICAL DATA:  Fall. EXAM: PORTABLE PELVIS 1-2 VIEWS COMPARISON:  None. FINDINGS: There is no evidence of pelvic fracture or diastasis. No pelvic bone lesions are seen. IMPRESSION: Negative. Electronically Signed   By: Lupita Raider M.D.   On: 06/22/2020 14:52   CT Maxillofacial Wo Contrast  Result Date: 06/22/2020 CLINICAL DATA:  Fall, facial trauma EXAM: CT MAXILLOFACIAL WITHOUT CONTRAST TECHNIQUE: Multidetector CT imaging of the maxillofacial structures was performed. Multiplanar CT image reconstructions were also generated. COMPARISON:  None. FINDINGS: Osseous: Several acute fractures identified: Left orbital floor with comminution and depression of fragments into the maxillary sinus antrum. There is involvement of the infraorbital foramen. No apparent involvement of the  inferior rectus. Lateral left orbital wall with mild comminution, displacement, and angulation. Posterior and lateral walls of the left maxillary sinus with comminution, displacement, angulation. Some fracture fragments extend into the sinus antrum. Nondisplaced fracture through the left zygomatic arch. Mildly displaced fractures of the frontal and temporal processes of the left zygoma. Nasal bones appear intact. Pterygoid plates are intact. Mandible is intact and normally located. Orbits: Left periorbital hematoma. There is mild hematoma  along the floor and superolateral portion of the orbit. Sinuses: Hemorrhage within the left maxillary sinus. Soft tissues: Left frontal scalp, periorbital, and facial soft tissue swelling. Limited intracranial: Dictated separately. IMPRESSION: Acute fractures of the lateral and inferior walls of the left orbit as described. Acute fractures of the posterior and lateral walls of the left maxillary sinus as described. Acute fractures of the left zygoma as described. Mild extraconal hematoma along the floor and superolateral left orbit. Electronically Signed   By: Guadlupe Spanish M.D.   On: 06/22/2020 15:05    ____________________________________________   PROCEDURES  Procedure(s) performed (including Critical Care):  Procedures   ____________________________________________   INITIAL IMPRESSION / ASSESSMENT AND PLAN / ED COURSE        Overall patient's history, exam, initial ED work-up was concerning for unwitnessed ground-level fall of unclear etiology i.e. syncope versus mechanical with subsequent intraventricular hemorrhage and left-sided facial fractures as noted above.  Patient does follow commands all extremities and opens eyes spontaneously although he is somewhat confused.  GCS 13 and do not believe patient requires intubation for protection of this time.  Patient accepted to [facility] emergency department.  Accepted by Dr Carolann Littler.   No obvious rib  fractures, pneumohemothorax and chest x-ray and pelvis is unremarkable on pelvic x-ray.  Patient otherwise has no evidence of significant neurovascular injury in his extremities or evidence of significant trauma on exam.   Medications  ondansetron (ZOFRAN) injection 4 mg (has no administration in time range)  fentaNYL (SUBLIMAZE) injection 50 mcg (has no administration in time range)  Tdap (BOOSTRIX) injection 0.5 mL (has no administration in time range)  iohexol (OMNIPAQUE) 300 MG/ML solution 100 mL (100 mLs Intravenous Contrast Given 06/22/20 1520)        ____________________________________________   FINAL CLINICAL IMPRESSION(S) / ED DIAGNOSES  Final diagnoses:  Fall  IVH (intraventricular hemorrhage) (HCC)  Closed fracture of left zygomatic bone (HCC)  Closed fracture of left side of maxilla, initial encounter (HCC)  Fracture of lateral orbital wall, left side, initial encounter for closed fracture (HCC)  Altered mental status, unspecified altered mental status type     ED Discharge Orders    None       Note:  This document was prepared using Dragon voice recognition software and may include unintentional dictation errors.   Gilles Chiquito, MD 06/22/20 1523    Gilles Chiquito, MD 06/22/20 1539

## 2020-07-06 DIAGNOSIS — I2699 Other pulmonary embolism without acute cor pulmonale: Secondary | ICD-10-CM | POA: Diagnosis not present

## 2020-07-06 DIAGNOSIS — S06369D Traumatic hemorrhage of cerebrum, unspecified, with loss of consciousness of unspecified duration, subsequent encounter: Secondary | ICD-10-CM | POA: Diagnosis not present

## 2020-07-06 DIAGNOSIS — I82512 Chronic embolism and thrombosis of left femoral vein: Secondary | ICD-10-CM | POA: Diagnosis not present

## 2020-07-06 DIAGNOSIS — I10 Essential (primary) hypertension: Secondary | ICD-10-CM | POA: Diagnosis not present

## 2020-07-06 DIAGNOSIS — G629 Polyneuropathy, unspecified: Secondary | ICD-10-CM | POA: Diagnosis not present

## 2020-07-06 DIAGNOSIS — J45909 Unspecified asthma, uncomplicated: Secondary | ICD-10-CM | POA: Diagnosis not present

## 2020-07-06 DIAGNOSIS — G8911 Acute pain due to trauma: Secondary | ICD-10-CM | POA: Diagnosis not present

## 2020-07-06 DIAGNOSIS — S0285XD Fracture of orbit, unspecified, subsequent encounter for fracture with routine healing: Secondary | ICD-10-CM | POA: Diagnosis not present

## 2020-07-06 DIAGNOSIS — F0390 Unspecified dementia without behavioral disturbance: Secondary | ICD-10-CM | POA: Diagnosis not present

## 2020-07-06 DIAGNOSIS — E785 Hyperlipidemia, unspecified: Secondary | ICD-10-CM | POA: Diagnosis not present

## 2020-07-06 DIAGNOSIS — I2510 Atherosclerotic heart disease of native coronary artery without angina pectoris: Secondary | ICD-10-CM | POA: Diagnosis not present

## 2020-07-06 DIAGNOSIS — S0240FD Zygomatic fracture, left side, subsequent encounter for fracture with routine healing: Secondary | ICD-10-CM | POA: Diagnosis not present

## 2020-07-06 DIAGNOSIS — K649 Unspecified hemorrhoids: Secondary | ICD-10-CM | POA: Diagnosis not present

## 2020-09-20 DEATH — deceased

## 2021-03-28 IMAGING — CT CT CERVICAL SPINE W/O CM
3 of 4 series · 11 of 35 positions shown, 13 images · non-contrast
Comparison: None.

CLINICAL DATA: Fall

EXAM:
CT CERVICAL SPINE WITHOUT CONTRAST
TECHNIQUE: Multidetector CT imaging of the cervical spine was performed without
intravenous contrast. Multiplanar CT image reconstructions were also
generated.

[Series 5: sagittal bone · sagittal · 0.21mm/px · 5 of 82 slices shown, 6 images]
[im 28/82  bone]
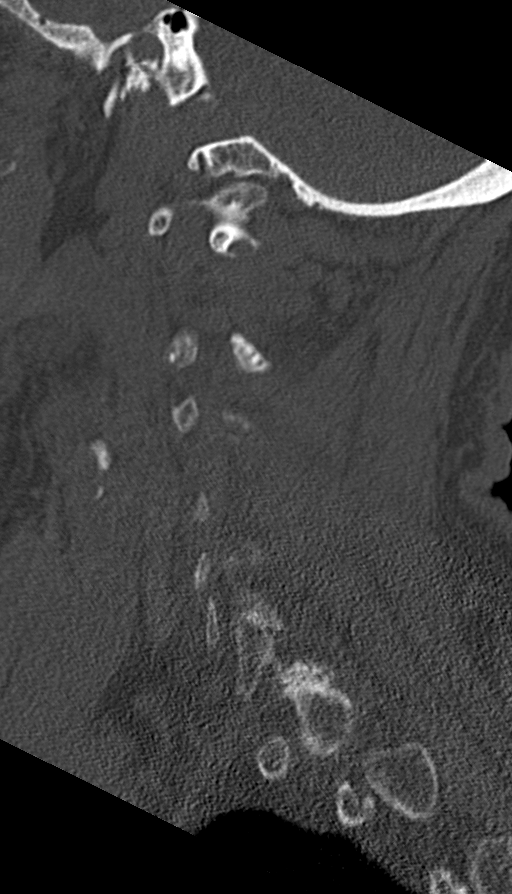
[im 34/82  bone]
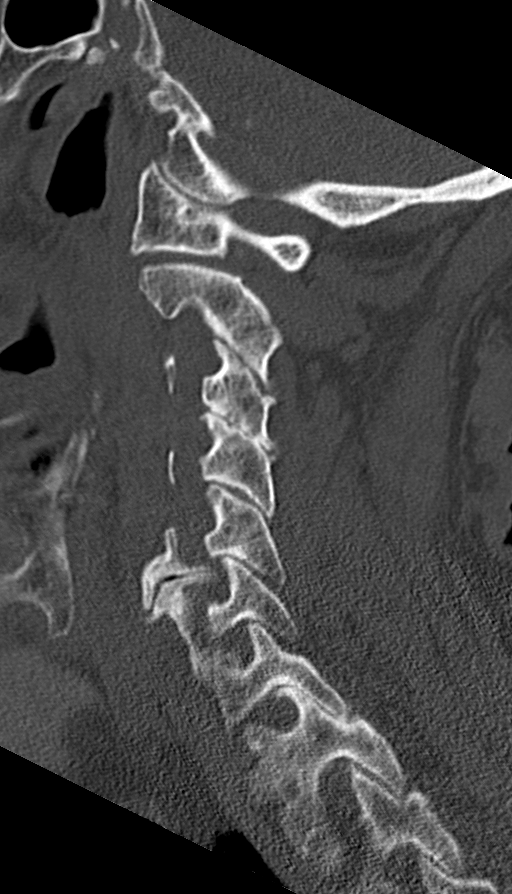
[im 41/82  soft-tissue]
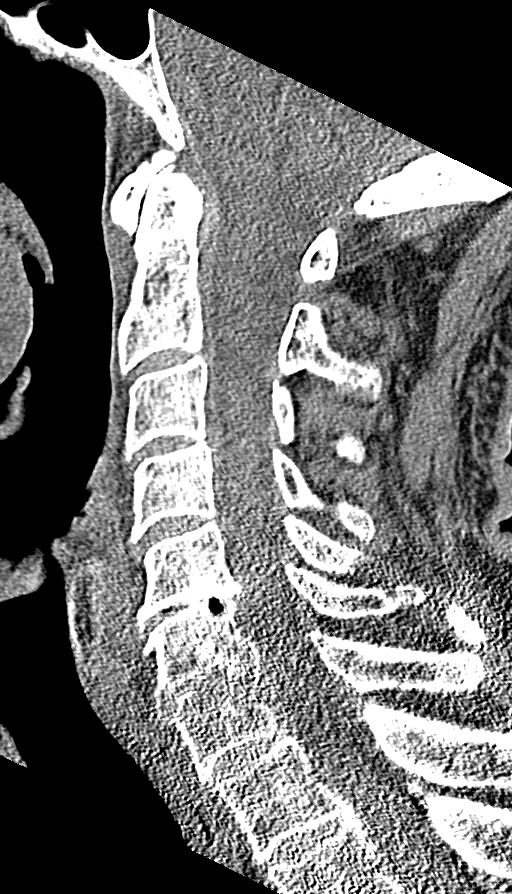
[im 41/82  bone]
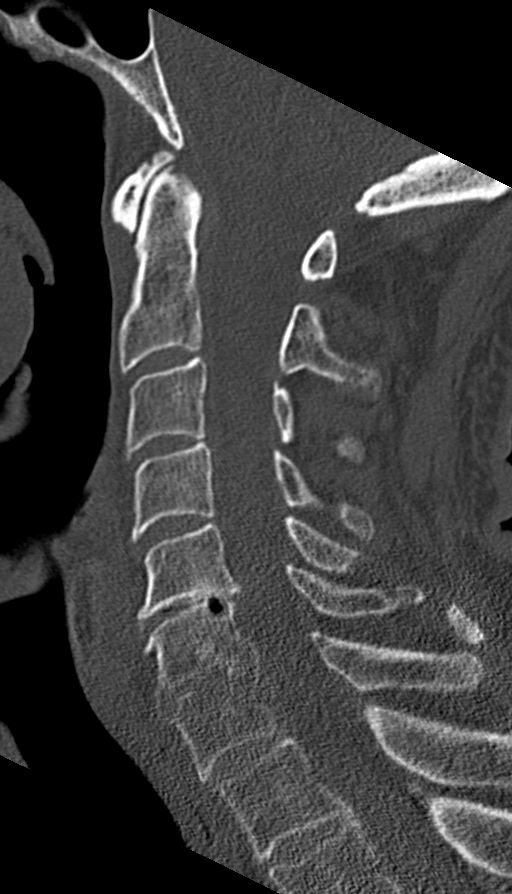
[im 48/82  bone]
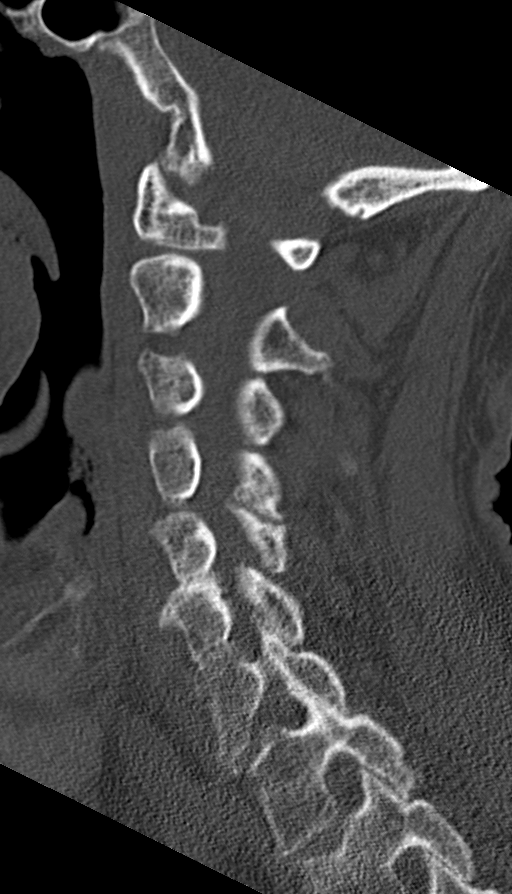
[im 55/82  bone]
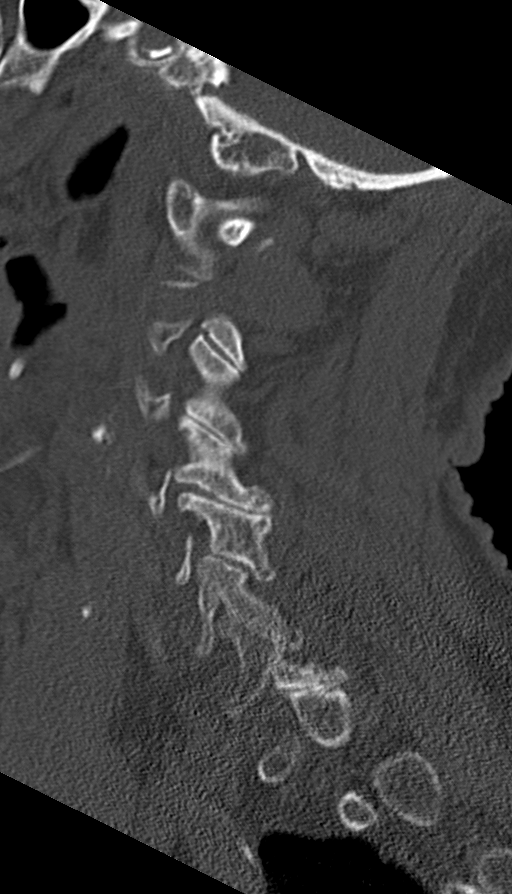

[Series 6: coronal bone · coronal · 0.32mm/px · 3 of 54 slices shown]
[im 11/54  bone]
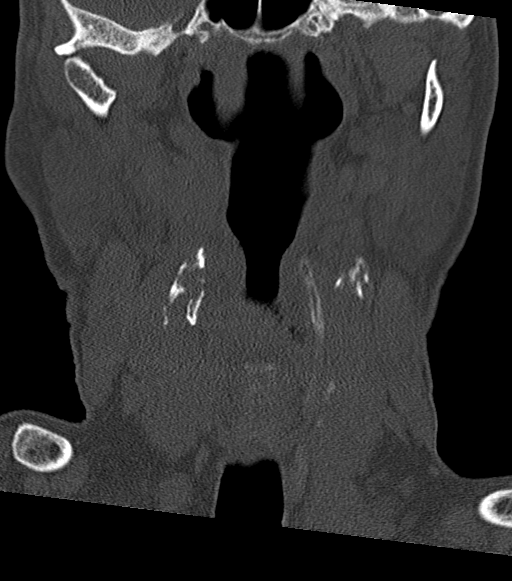
[im 22/54  bone]
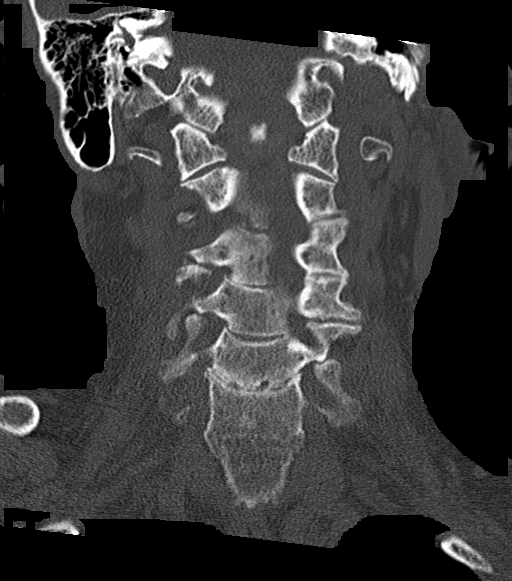
[im 32/54  bone]
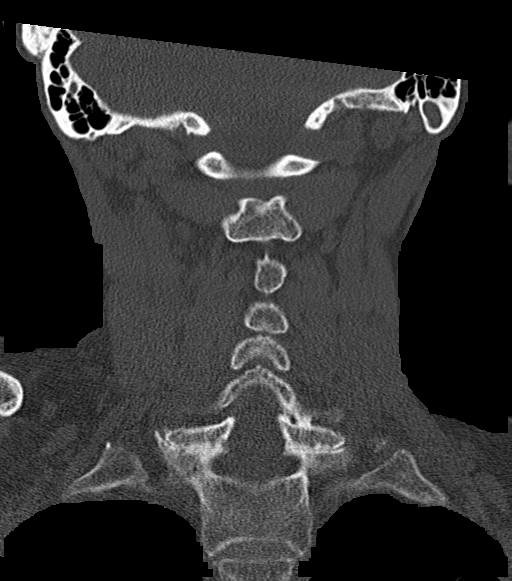

[Series 7: orthogonal axials · axial · 0.21mm/px · z∈[-266,-174]mm · 3 of 93 slices shown, 4 images]
[im 27/93  soft-tissue]
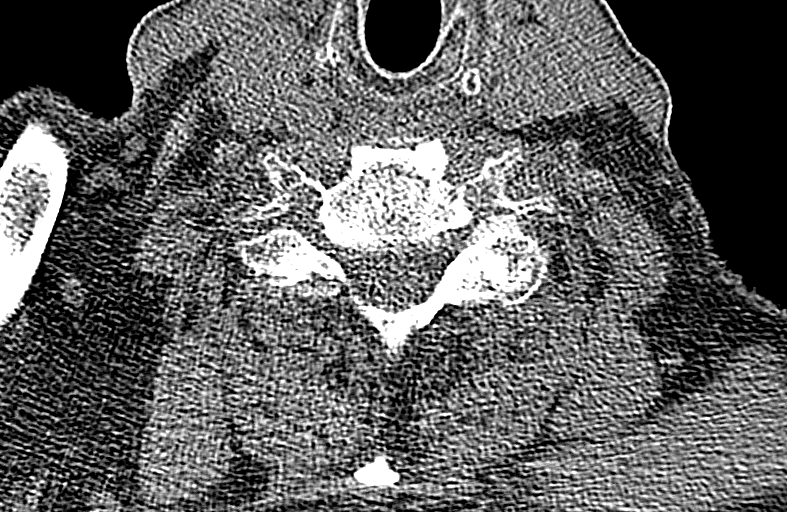
[im 27/93  bone]
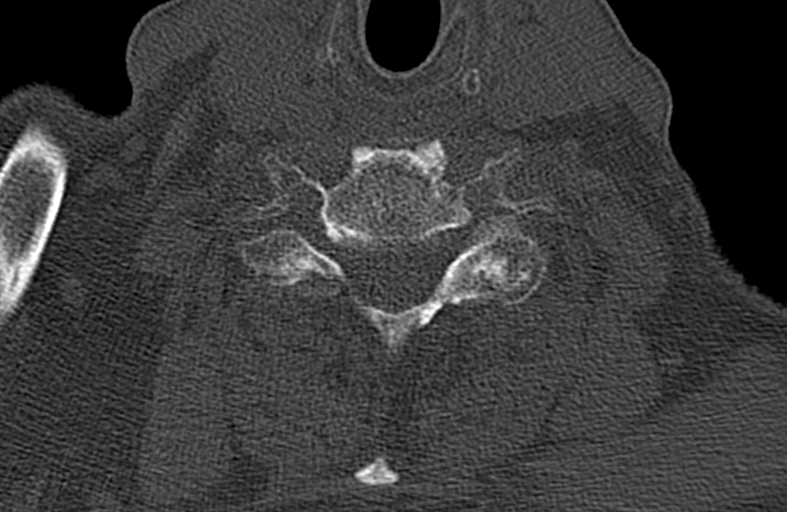
[im 53/93  bone]
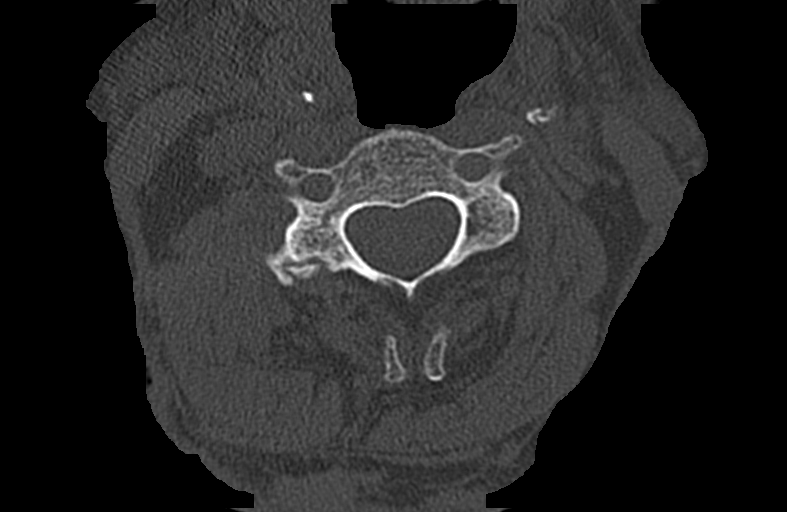
[im 79/93  bone]
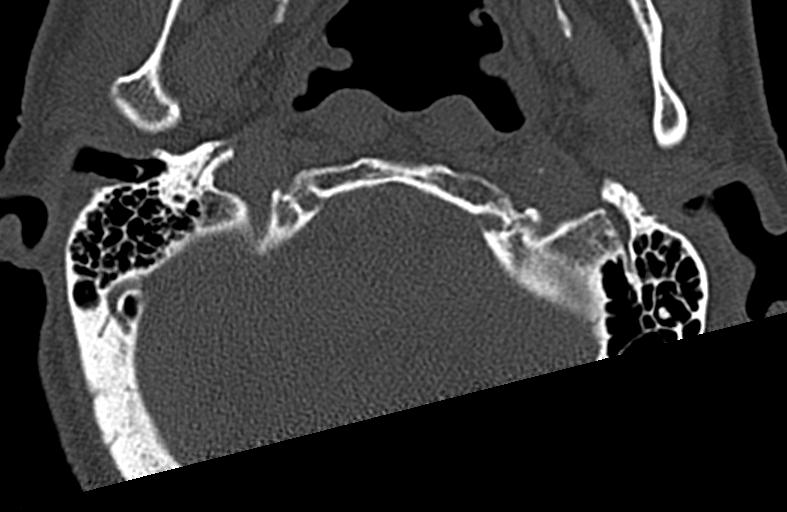

[11 of 35 positions shown; findings below may reference images not displayed]

FINDINGS: Alignment: No significant listhesis.

Skull base and vertebrae: No acute cervical spine fracture.

Soft tissues and spinal canal: No prevertebral fluid or swelling. No
visible canal hematoma.

Disc levels: Multilevel degenerative changes are present including
disc space narrowing, endplate osteophytes, and facet and
uncovertebral hypertrophy.

Upper chest: Refer to separate CT of the chest.

Other: Calcified plaque at the common carotid bifurcations.
IMPRESSION: No acute cervical spine fracture.
